# Patient Record
Sex: Female | Born: 1990 | Race: White | Hispanic: No | Marital: Married
Health system: Midwestern US, Community
[De-identification: ages and names within clinical notes are randomized; demographics above are authoritative.]

## PROBLEM LIST (undated history)

## (undated) DIAGNOSIS — Z79899 Other long term (current) drug therapy: Secondary | ICD-10-CM

## (undated) DIAGNOSIS — F909 Attention-deficit hyperactivity disorder, unspecified type: Secondary | ICD-10-CM

---
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
## 2012-04-08 IMAGING — US ABDOMEN ULTRASOUND
1 series · 17 of 25 positions shown · non-contrast
Comparison: none

REASON FOR EXAM: abdominal pain and   pelvic pain
COMMENTS:

[Series 1: abdomen ultrasound · 17 of 59 slices shown]
[im 1/59]
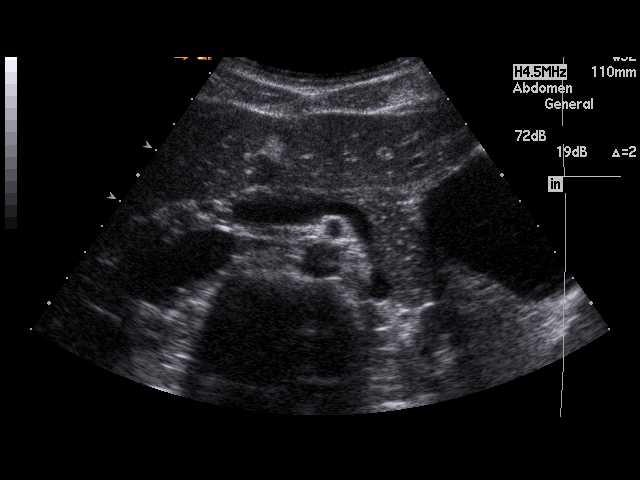
[im 5/59]
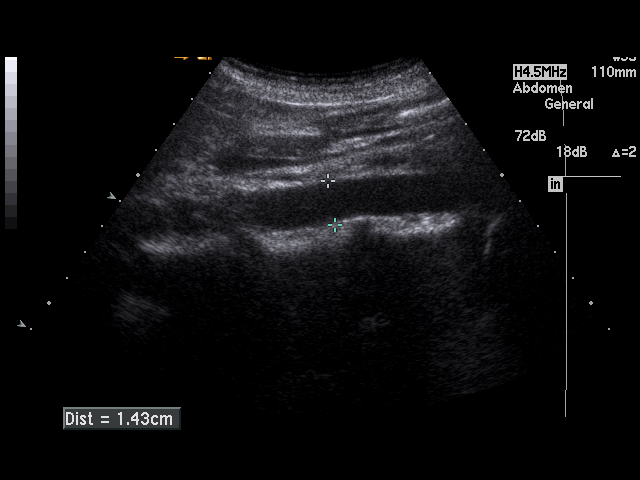
[im 8/59]
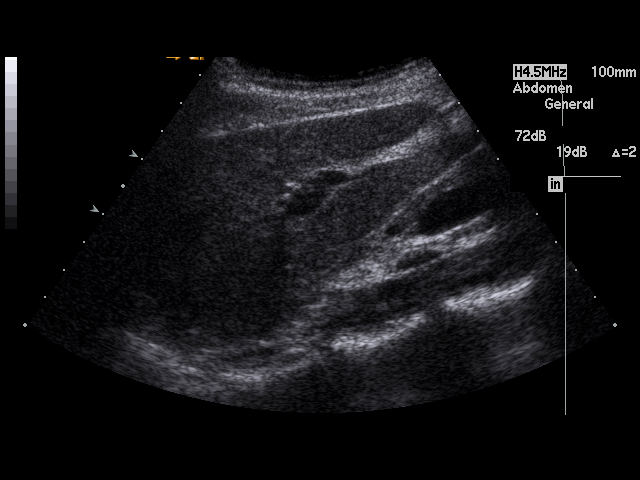
[im 13/59]
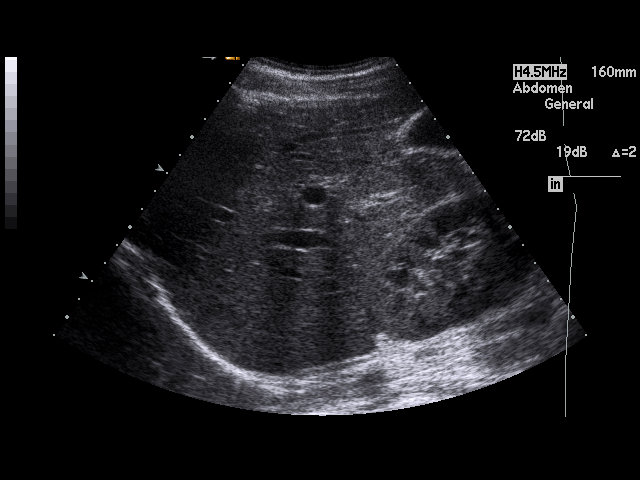
[im 15/59]
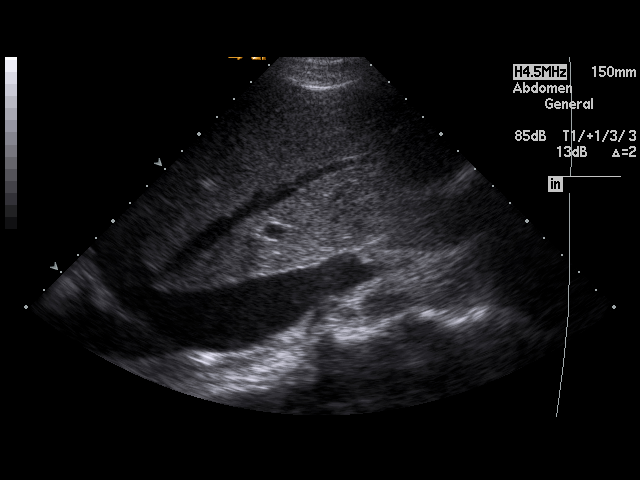
[im 20/59]
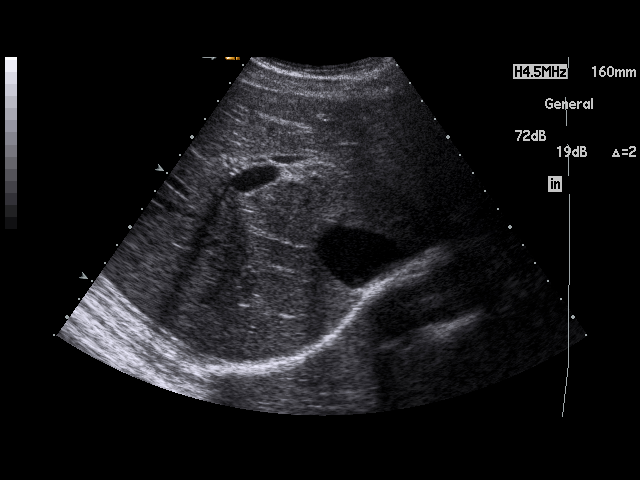
[im 22/59]
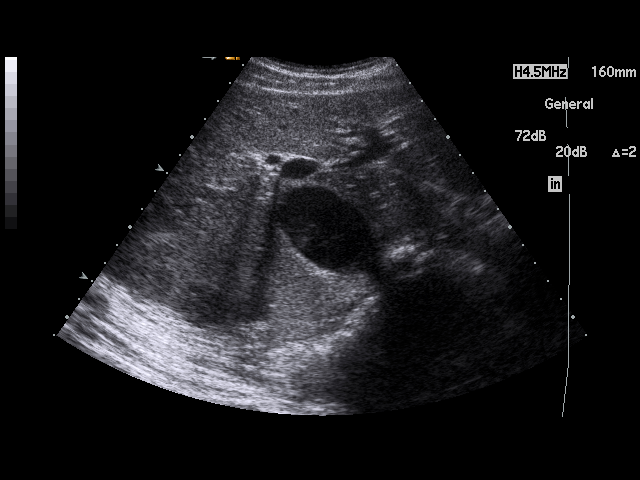
[im 27/59]
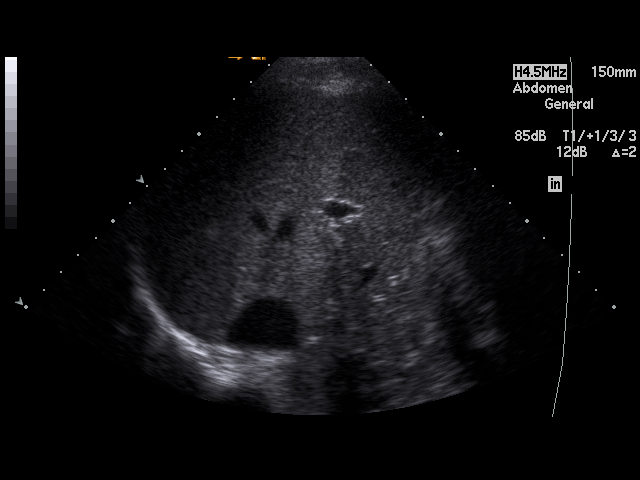
[im 30/59]
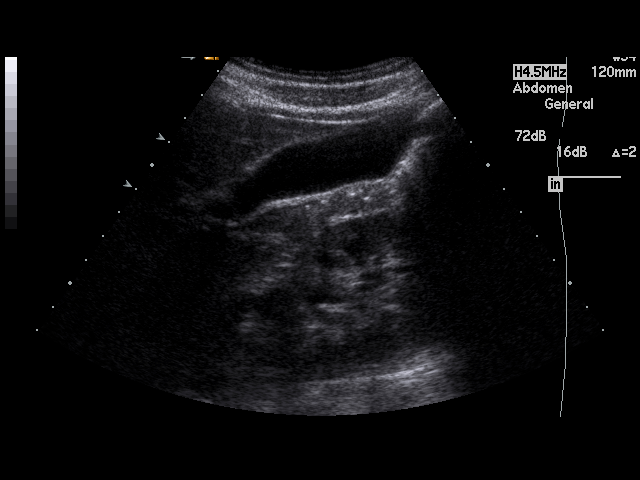
[im 32/59]
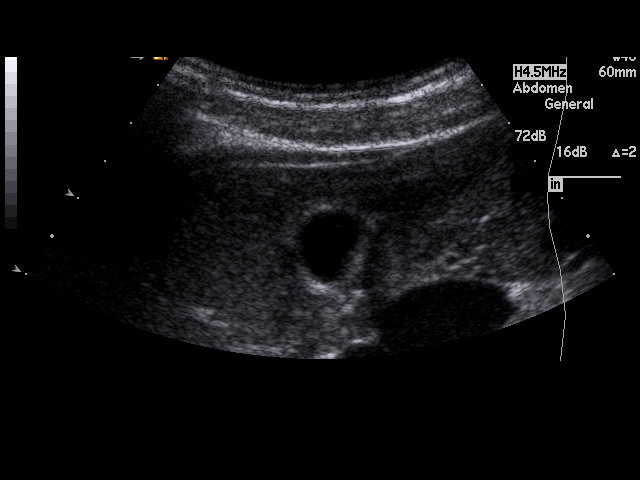
[im 37/59]
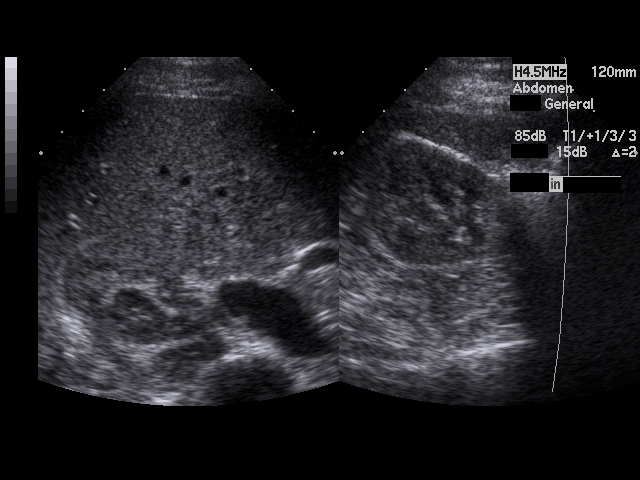
[im 39/59]
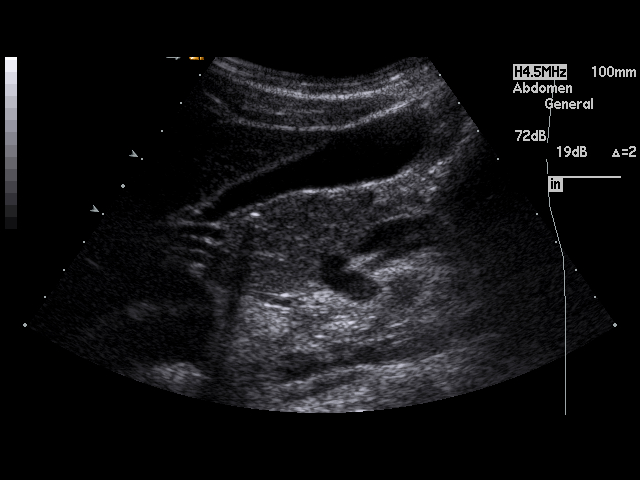
[im 44/59]
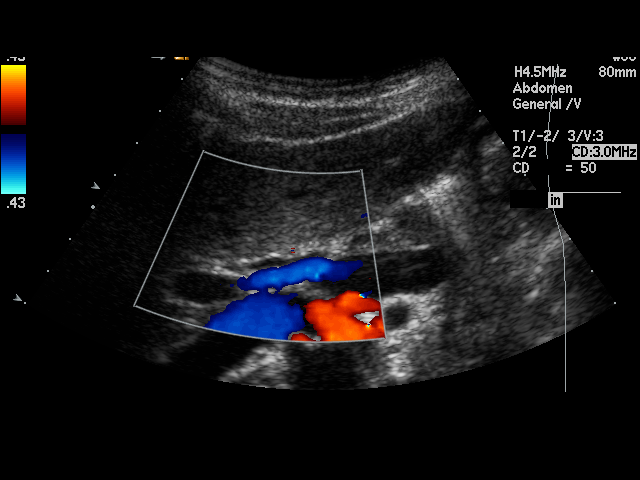
[im 46/59]
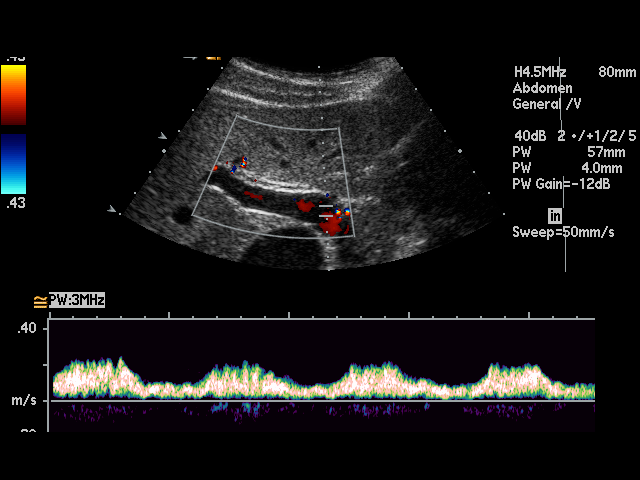
[im 51/59]
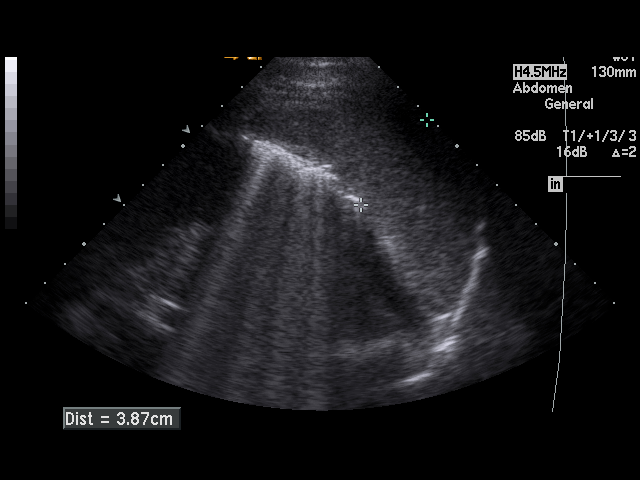
[im 54/59]
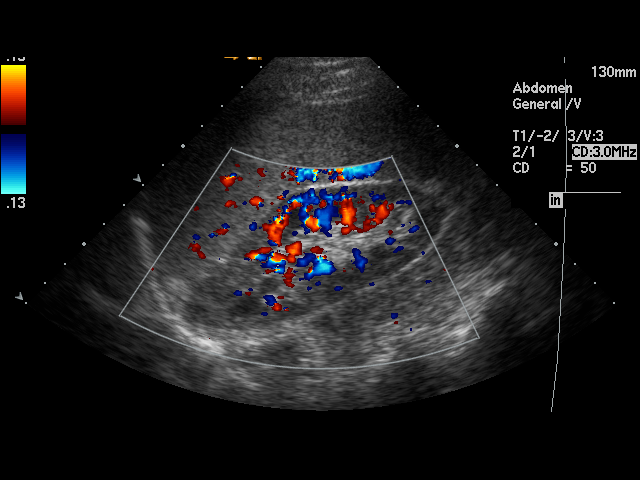
[im 59/59]
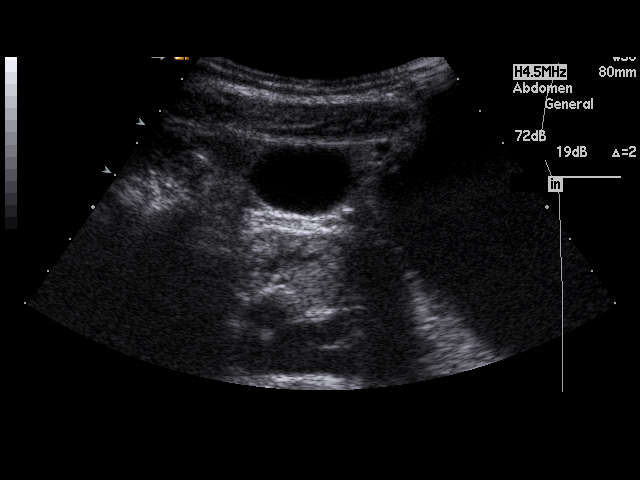

[17 of 25 positions shown; findings below may reference images not displayed]

PROCEDURE:     CALEB - CALEB ABDOMEN GENERAL SURVEY  - November 21, 2009  [DATE]

RESULT:     The liver, pancreas, abdominal aorta and inferior vena cava show
no significant abnormalities. The spleen measures 12.27 cm at maximum AP
diameter and is prominent for the patient's age. The possibility of mild
splenic enlargement cannot be excluded. No gallstones are seen. There is no
thickening of the gallbladder wall. The common bile duct measures 2.6 mm in
diameter which is within normal limits. The kidneys show no hydronephrosis.
There is no ascites.
IMPRESSION: 1. Possible mild splenic enlargement.
2. Otherwise, normal study.

## 2012-04-08 IMAGING — US TRANSABDOMINAL ULTRASOUND OF PELVIS
1 series · 17 of 25 positions shown · non-contrast
Comparison: none

REASON FOR EXAM: abd pain and   pelvic pain
COMMENTS:

[Series 1: transabdominal ultrasound of pelvis · 17 of 38 slices shown]
[im 1/38]
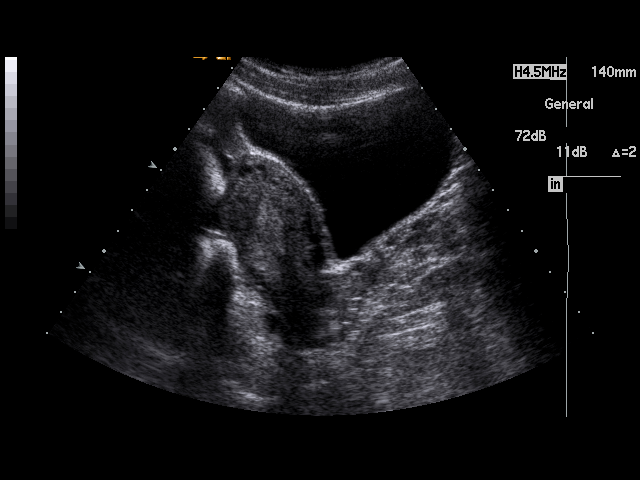
[im 4/38]
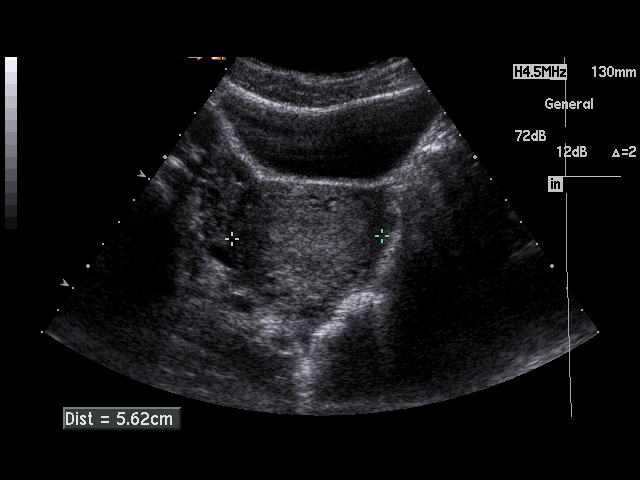
[im 5/38]
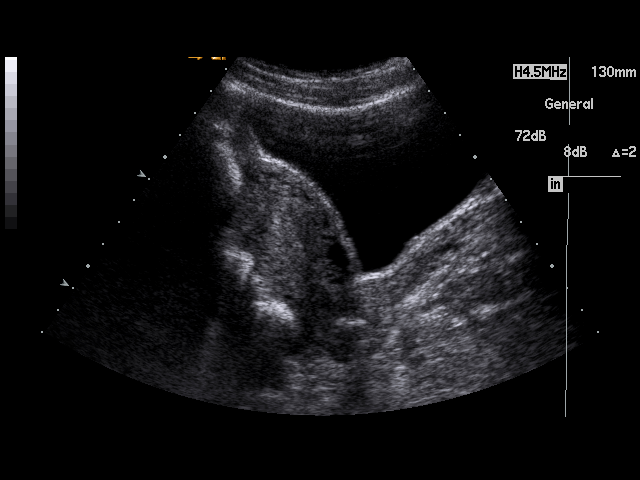
[im 8/38]
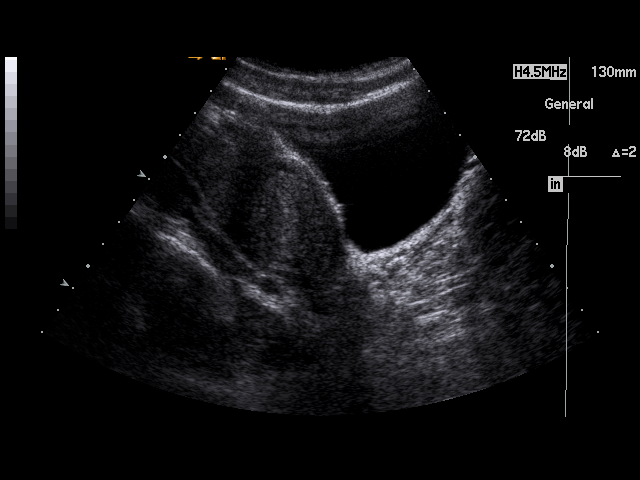
[im 10/38]
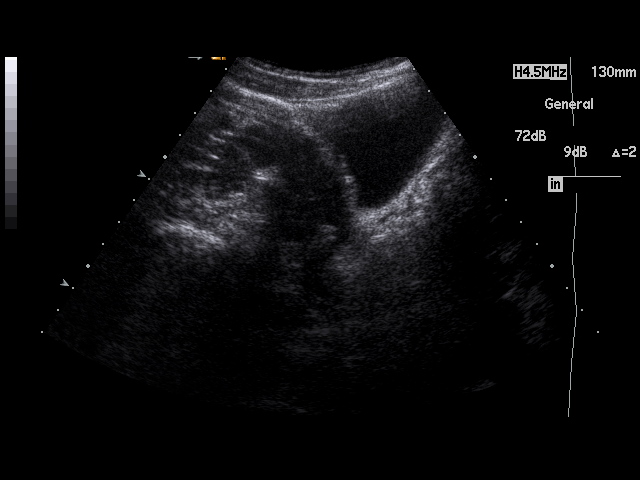
[im 13/38]
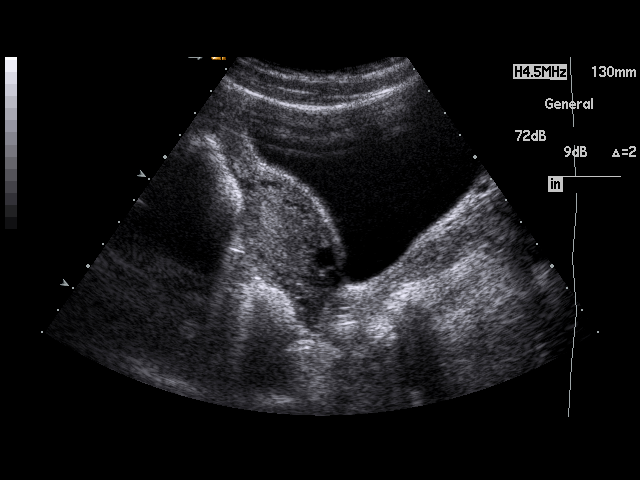
[im 14/38]
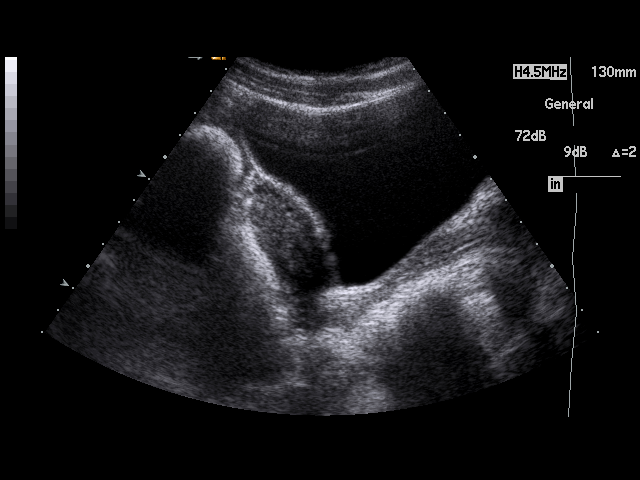
[im 17/38]
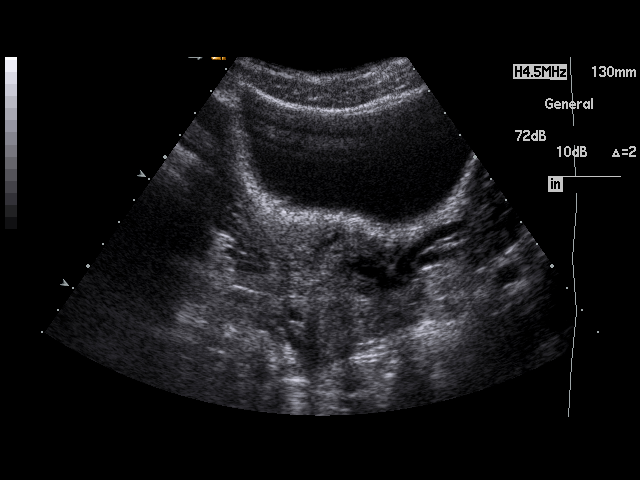
[im 19/38]
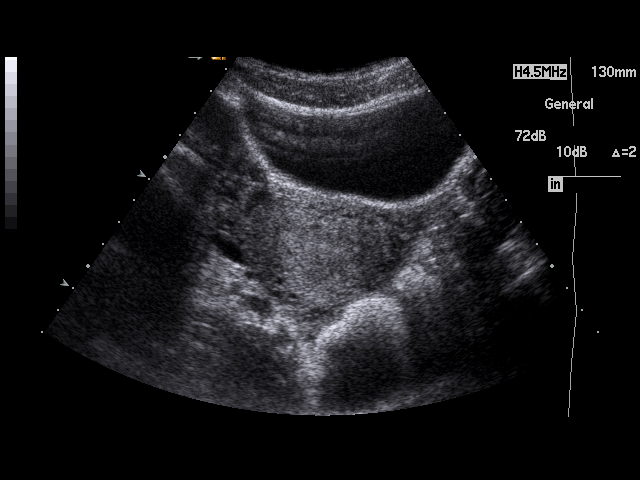
[im 21/38]
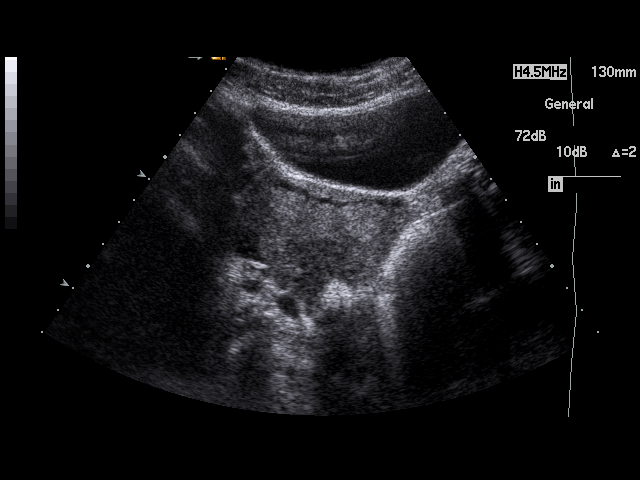
[im 24/38]
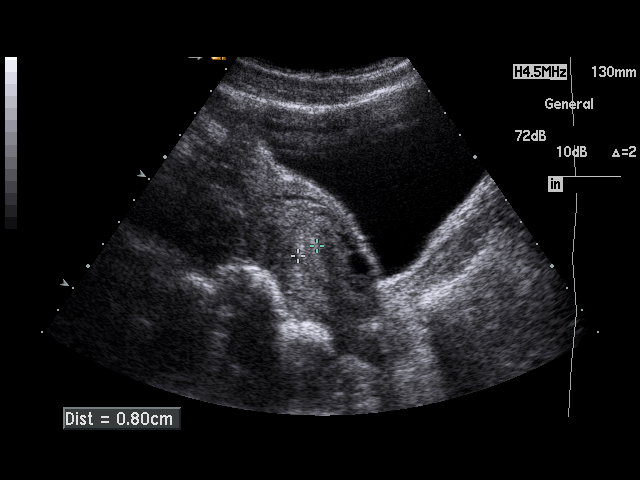
[im 25/38]
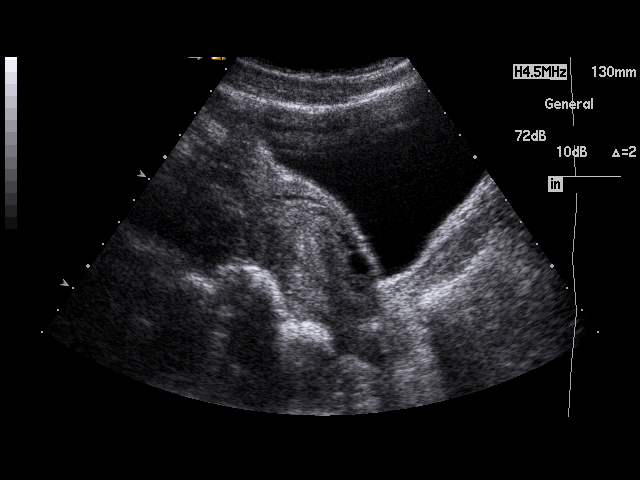
[im 28/38]
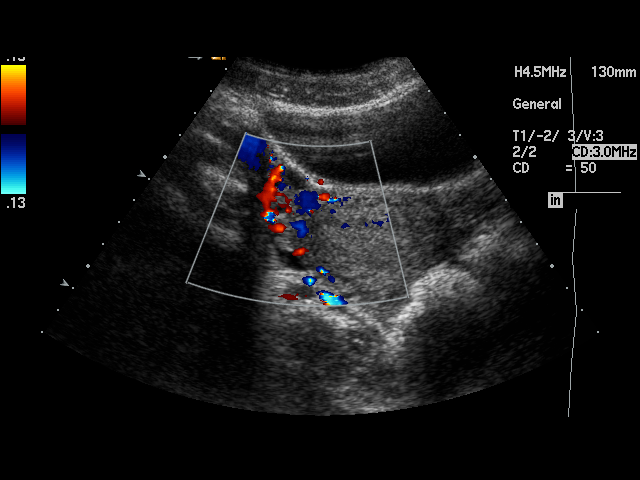
[im 30/38]
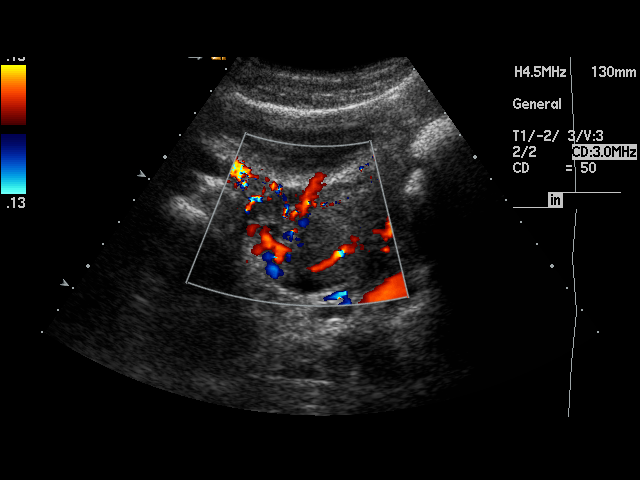
[im 33/38]
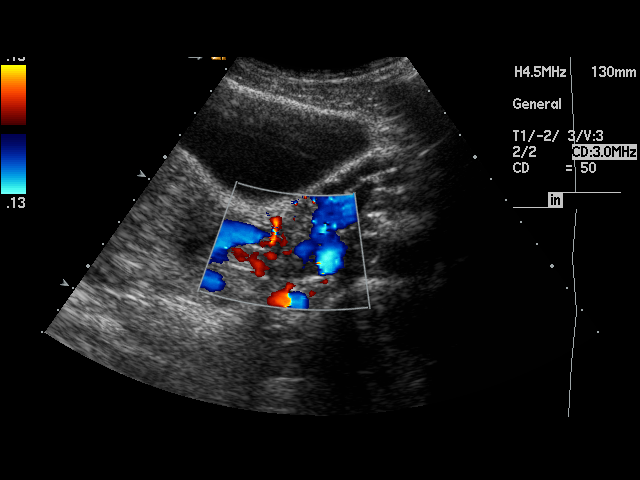
[im 34/38]
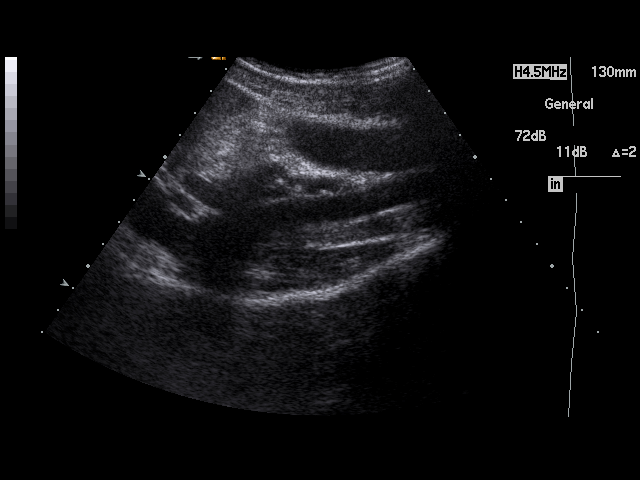
[im 38/38]
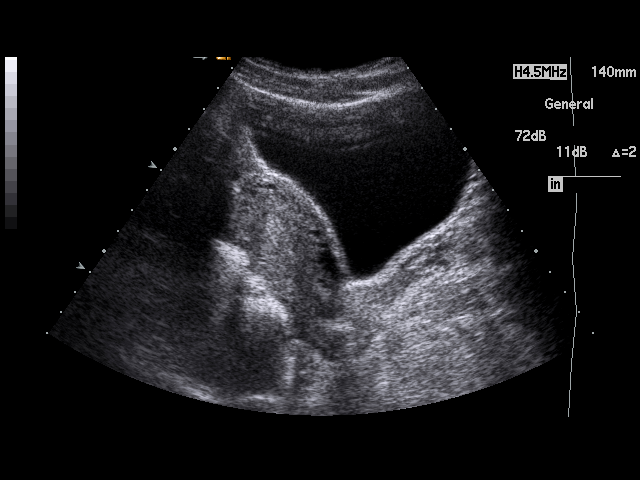

[17 of 25 positions shown; findings below may reference images not displayed]

PROCEDURE:     TIGER - TIGER PELVIS MASS EXAM  - [DATE] [DATE] [DATE]  [DATE]

RESULT:     Transabdominal Pelvic Ultrasound was performed. The uterus
measures 8.28 cm x 4.08 cm x 5.62 cm. No uterine mass lesions are seen. The
endometrium measures 8 mm in thickness. The right and left ovaries are
visualized. The right ovary measures 4.17 cm at maximum diameter and the
left ovary measures 4.32 cm at maximum diameter. No abnormal adnexal masses
are seen. No free fluid is observed in the pelvis. Vascular flow is seen in
both ovaries. The visualized portion of the urinary bladder is normal in
appearance.
IMPRESSION: No significant abnormalities are noted.

## 2013-04-05 IMAGING — CR LEFT WRIST - COMPLETE 3+ VIEW
1 series · 4 of 4 positions shown · non-contrast
Comparison: none

RESULT:      Images of the left wrist do not show a definite fracture,
dislocation or radiopaque foreign body. If there is concern for an occult
fracture followup images in 7 to 10 days would be recommended. If there is
concern for a carpal fracture followup MRI may be beneficial.

[Series 1: view not recorded · 0.17mm/px · 4 of 4 slices shown]
[im 1/4]
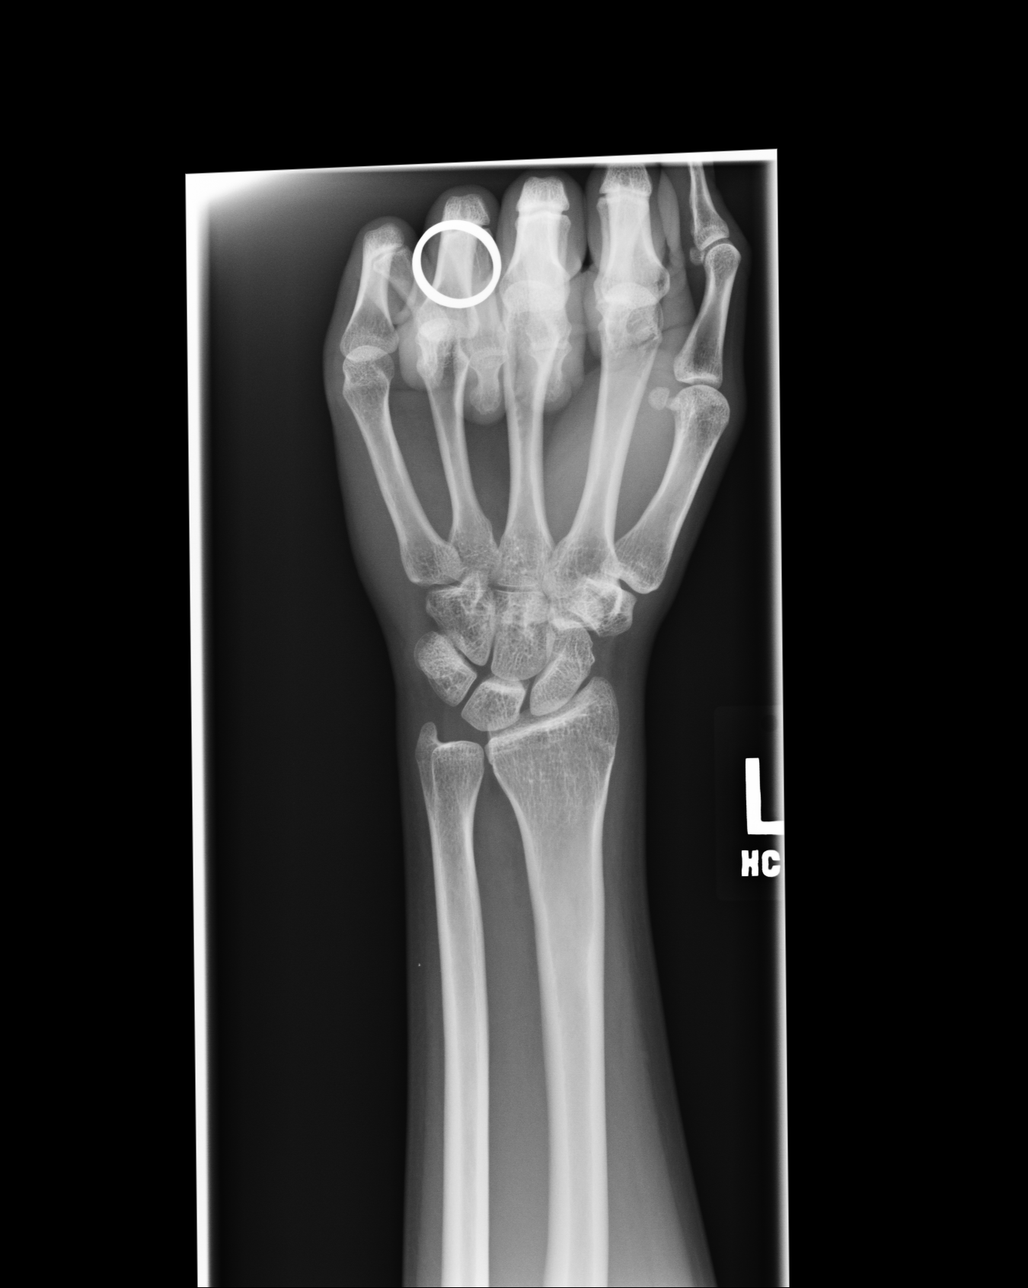
[im 2/4]
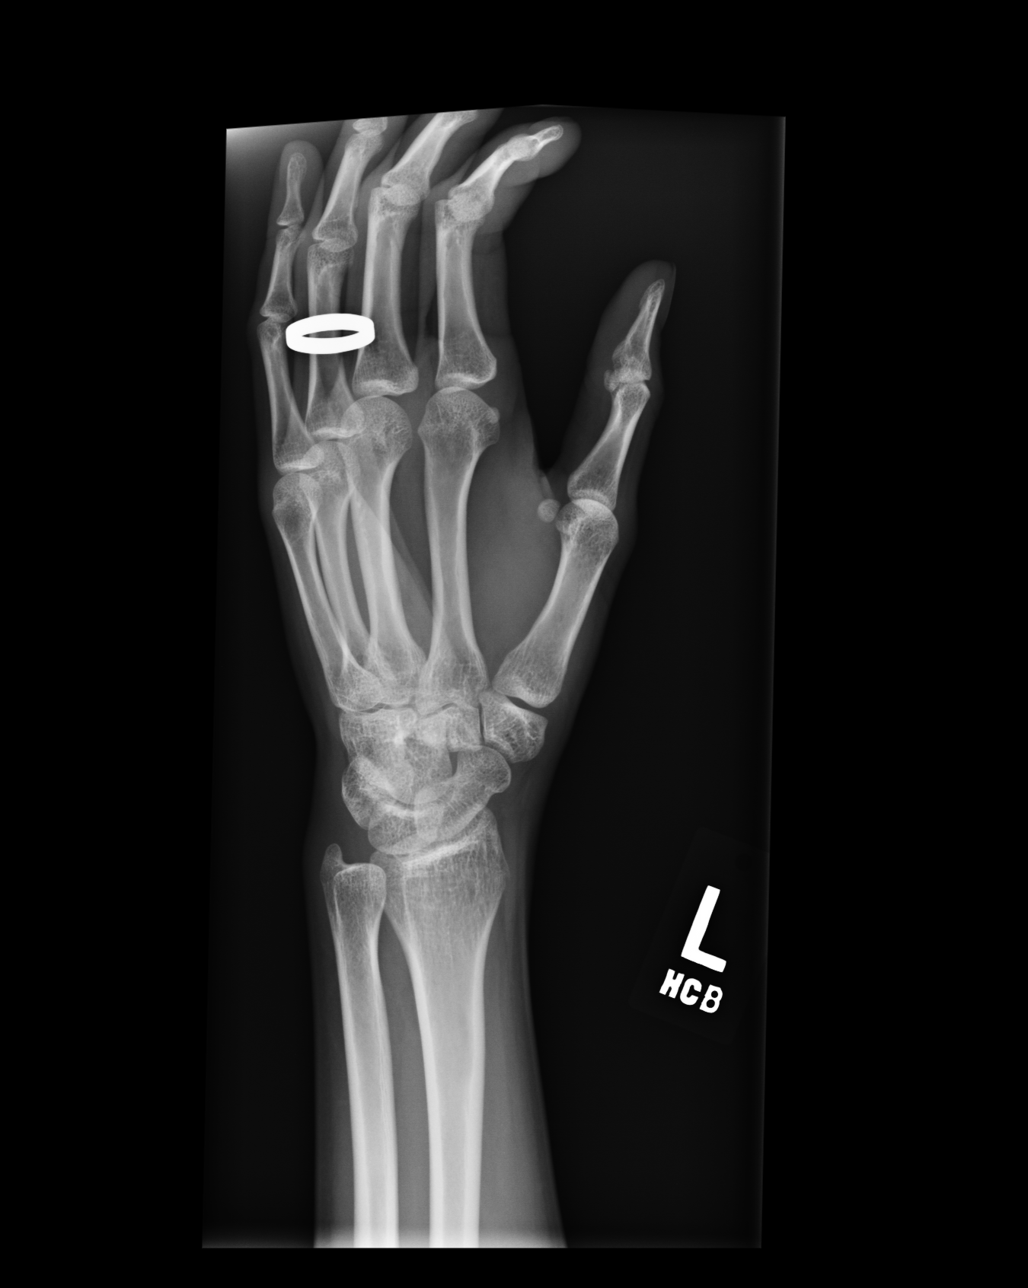
[im 3/4]
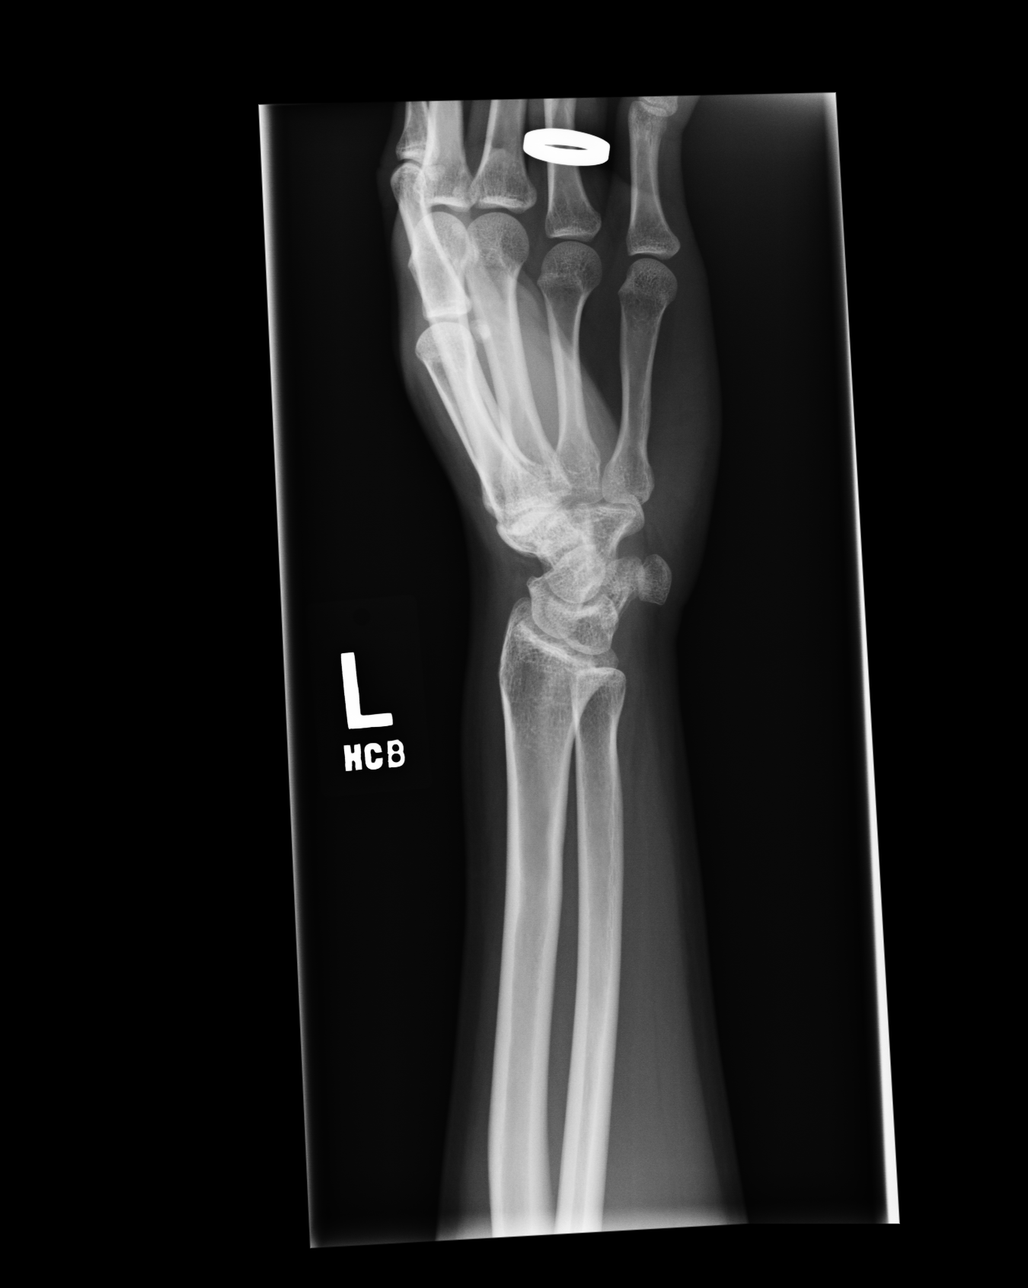
[im 4/4]
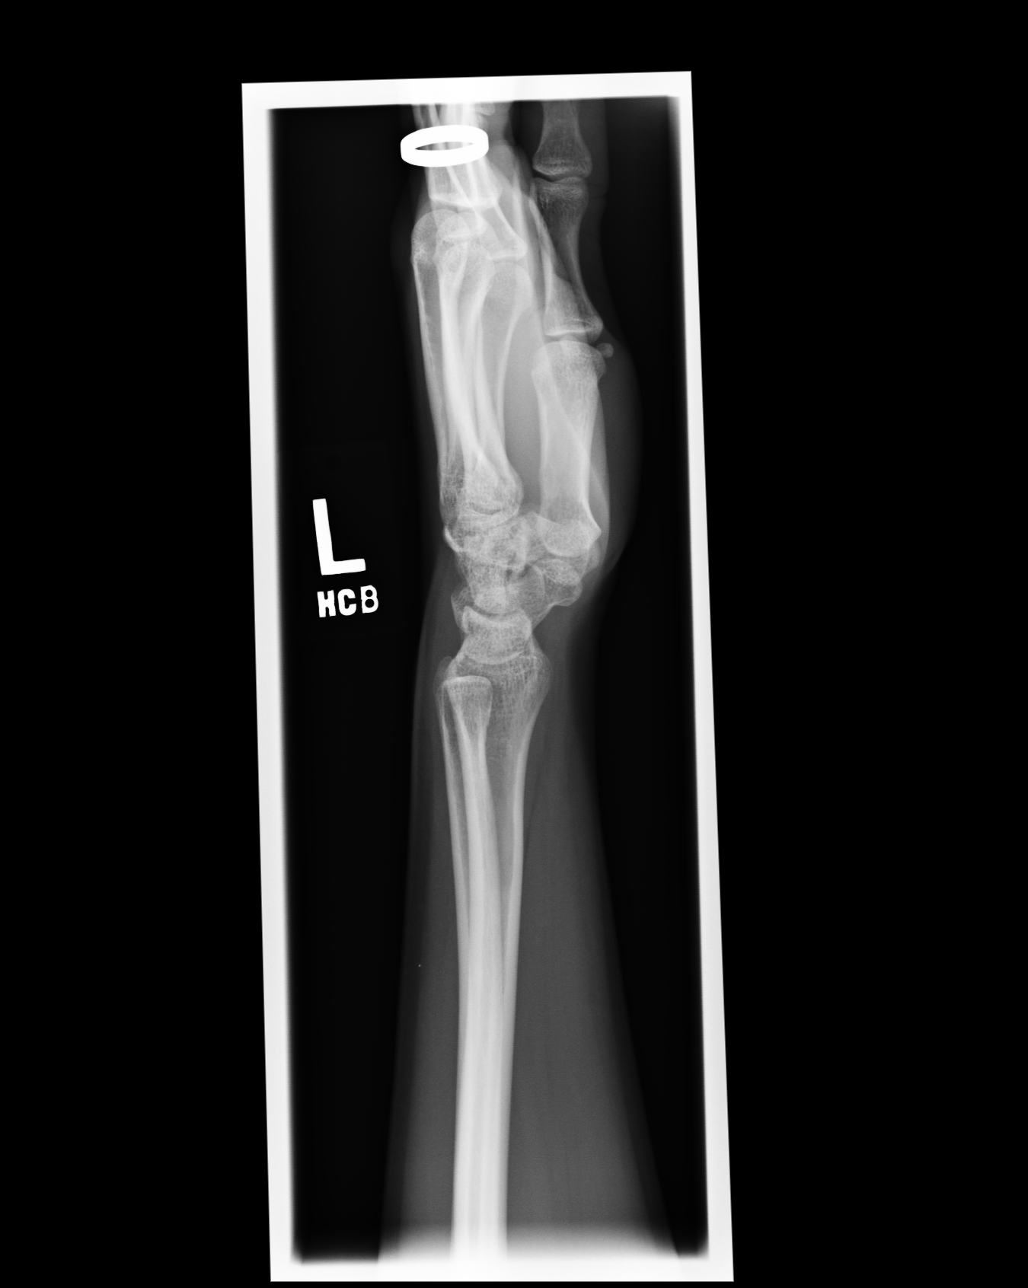

[4 of 4 positions shown; findings below may reference images not displayed]

IMPRESSION: Please see above.

## 2013-12-06 IMAGING — US ULTRASOUND RIGHT BREAST
1 series · 12 of 12 positions shown · non-contrast
Comparison: none

REASON FOR EXAM: rt brst mass 12 oclock
COMMENTS:

[Series 1: ultrasound right breast · 12 of 12 slices shown]
[im 1/12]
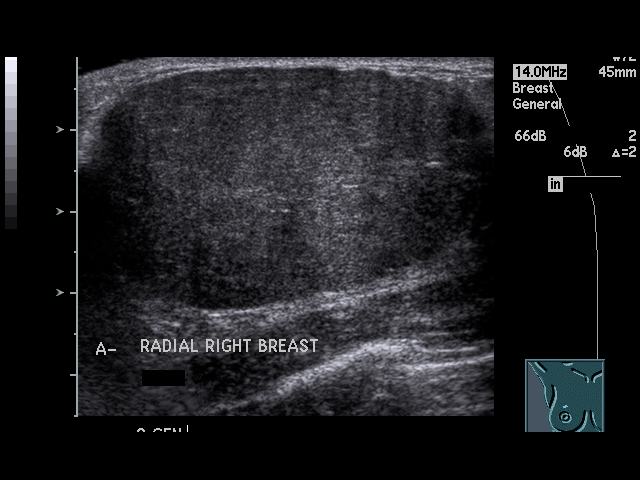
[im 2/12]
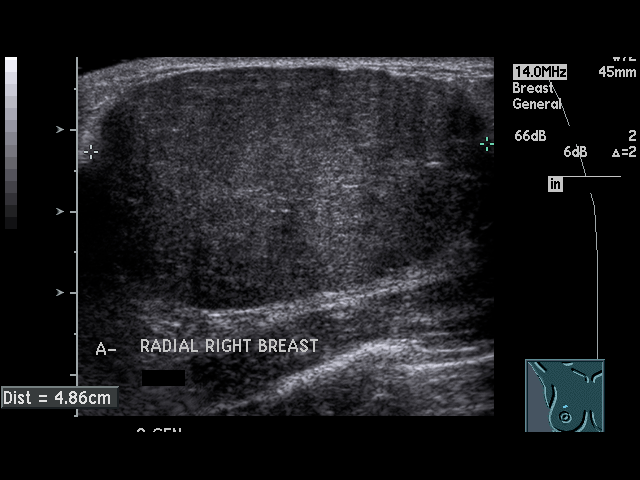
[im 3/12]
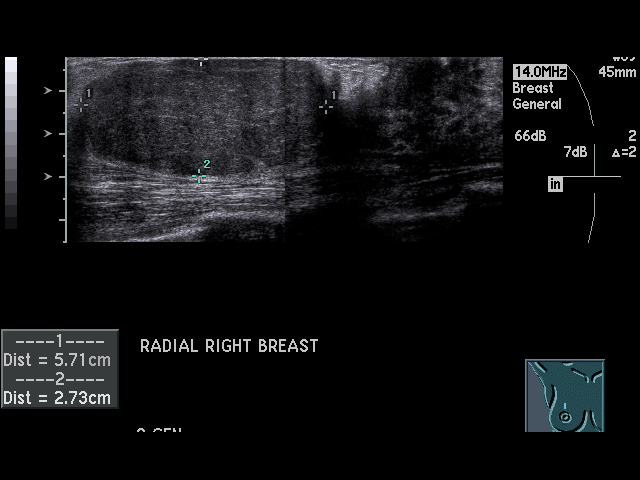
[im 4/12]
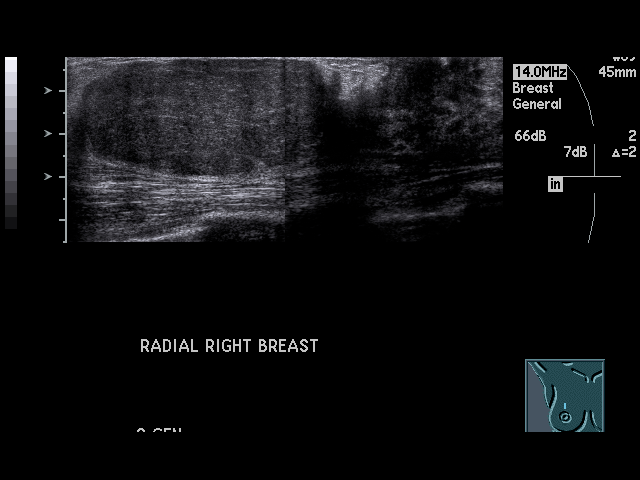
[im 5/12]
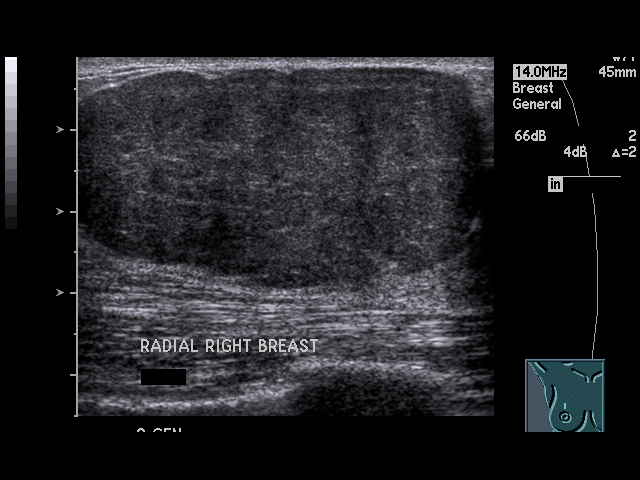
[im 6/12]
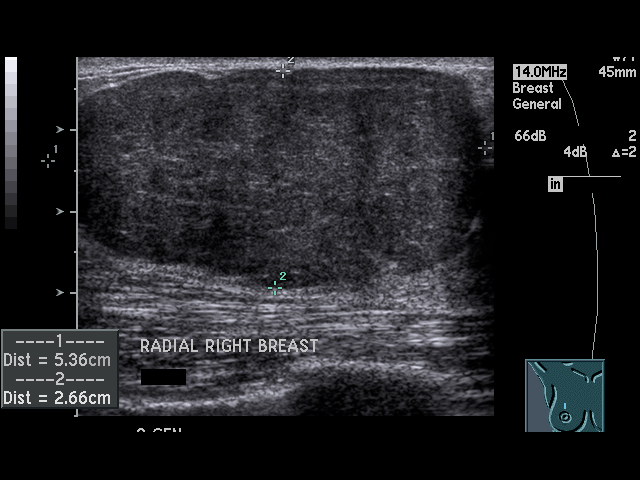
[im 7/12]
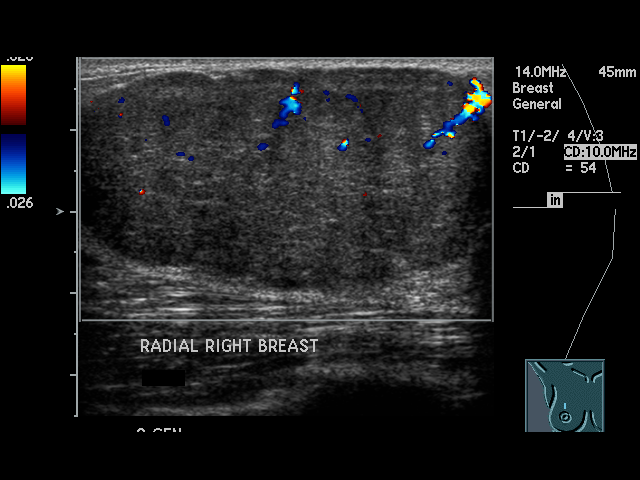
[im 8/12]
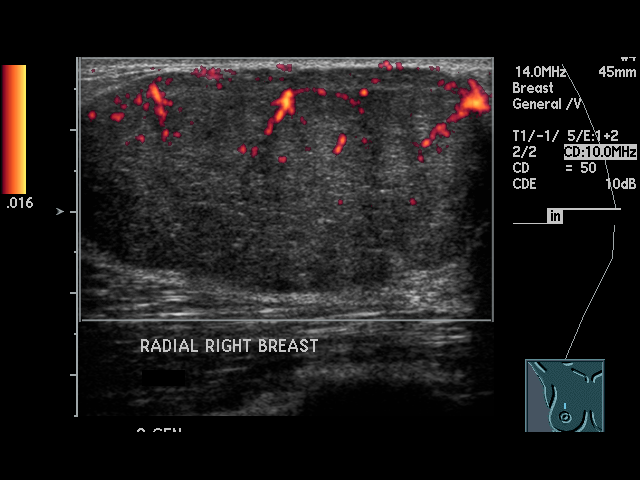
[im 9/12]
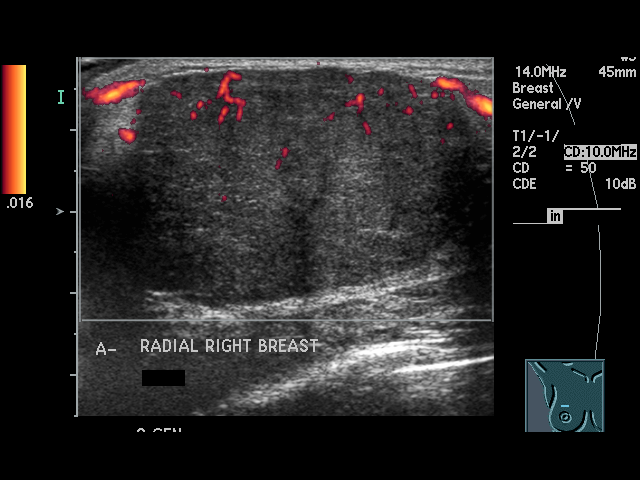
[im 10/12]
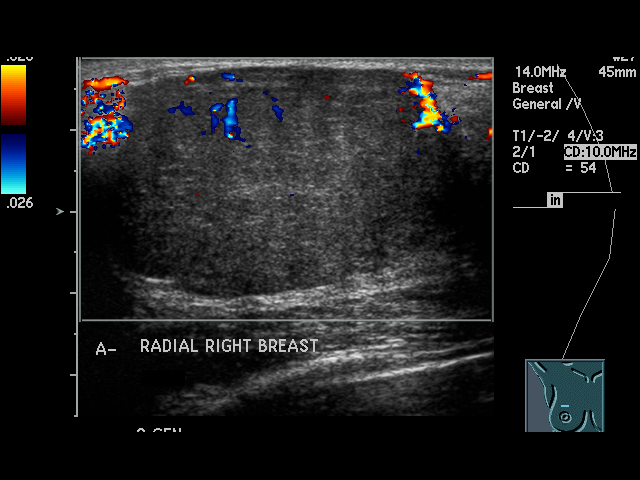
[im 11/12]
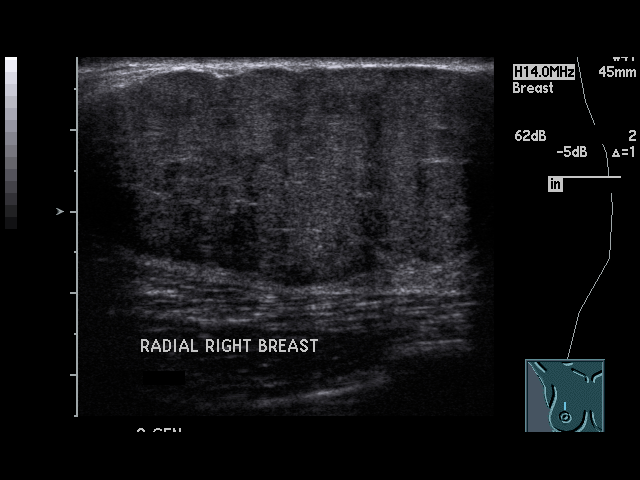
[im 12/12]
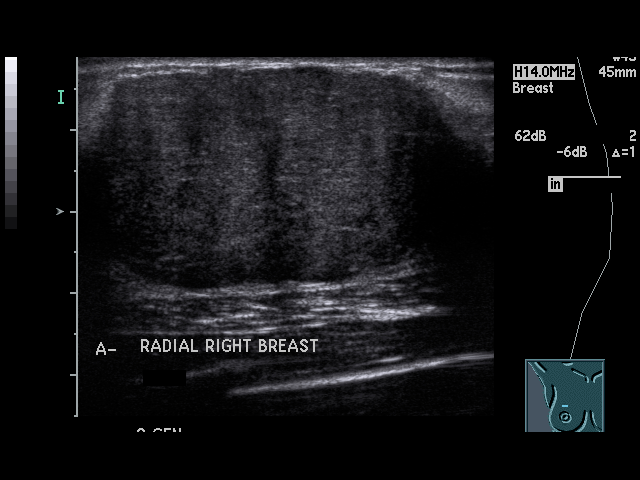

[12 of 12 positions shown; findings below may reference images not displayed]

PROCEDURE:     US  - US BREAST RIGHT  - July 21, 2011 [DATE]

RESULT:     The patient has a palpable right breast mass at 12 o'clock.
Ultrasound examination was performed and is compared to the prior exam of
November 01, 2009. A solid, oval shaped right breast mass at 12 o'clock is
again observed. On this exam, the mass measures 5.71 cm x 2.73 cm x 4.86 cm.
The mass has increased substantially in size as compared to the prior exam.
An enlarging fibroadenoma or a phyllodes tumor would be the primary
considerations as to etiology. Surgical consultation is recommended.
IMPRESSION: 1.     There is an enlarging solid mass at 12 o'clock. On this exam, the
mass measures 5.71 cm at maximum diameter.

## 2014-11-05 NOTE — Op Note (Signed)
PATIENT NAME:  Andrea NoonHARDING, Omah E MR#:  [id number] DATE OF BIRTH:  1990-12-10  DATE OF PROCEDURE:  08/08/2011  PREOPERATIVE DIAGNOSIS: Right breast mass.  POSTOPERATIVE DIAGNOSIS: Right breast mass.  OPERATION PERFORMED: Partial right mastectomy.    SURGEON: Claude MangesWilliam F. Jesiah Grismer, MD   ANESTHESIA: General.   SPECIMEN: 55 x 42 x 35 mm.   PROCEDURE IN DETAIL: The patient was placed supine on the Operating Room table and prepped and draped in the usual sterile fashion. A curvilinear superior skin incision was made in the right breast parallel with the edge of the areolar complex and about a centimeter or two away from that. This was carried down through the skin and subcutaneous tissue with electrocautery. The breast mass was essentially going from the skin to the pectoralis major muscle, and careful dissection allowed the entire mass to be excised along with a capsule or pseudocapsule. In no area was the mass violated during the dissection. This was all performed with the electrocautery, and hemostasis at the conclusion of the procedure was achieved with electrocautery. A small portion of pectoralis major muscle fascia was excised at the base. The wound was irrigated with warm normal saline which was suctioned clear, and a subdermal closure of interrupted 4-0 Monocryl was performed; and the skin was reapproximated with a running subcuticular 5-0 Monocryl, Mastisol, Steri-Strips and a compressive dressing. The patient tolerated the procedure well. There were no complications.   ____________________________ Claude MangesWilliam F. Lorenzo Pereyra, MD wfm:cbb D: 08/08/2011 12:29:19 ET T: 08/08/2011 13:01:00 ET JOB#: [id number]  cc: Claude MangesWilliam F. Juanmiguel Defelice, MD, <Dictator> Hennie Duosharles K. Lorin PicketScott, MD Claude MangesWILLIAM F Zoria Rawlinson MD ELECTRONICALLY SIGNED 08/09/2011 10:55

## 2015-03-28 ENCOUNTER — Ambulatory Visit: Payer: Self-pay | Admitting: Podiatry

## 2015-04-10 ENCOUNTER — Ambulatory Visit: Payer: Self-pay | Admitting: Podiatry

## 2016-11-11 ENCOUNTER — Other Ambulatory Visit: Payer: Self-pay | Admitting: Neurology

## 2016-11-11 DIAGNOSIS — G43719 Chronic migraine without aura, intractable, without status migrainosus: Secondary | ICD-10-CM

## 2016-11-22 ENCOUNTER — Ambulatory Visit: Payer: BLUE CROSS/BLUE SHIELD

## 2021-06-17 ENCOUNTER — Encounter
Admit: 2021-06-17 | Discharge: 2021-06-17 | Payer: BLUE CROSS/BLUE SHIELD | Attending: Family Medicine | Primary: Family Medicine

## 2021-06-17 DIAGNOSIS — F909 Attention-deficit hyperactivity disorder, unspecified type: Secondary | ICD-10-CM

## 2021-06-17 LAB — URINE DRUG SCREEN
Amphetamine, Urine: POSITIVE — AB
Barbiturates, Urine: NEGATIVE
Benzodiazepines, Urine: NEGATIVE
Cocaine, Urine: NEGATIVE
Methadone, Urine: NEGATIVE
Opiates, Urine: NEGATIVE
PCP, Urine: NEGATIVE
THC, TH-Cannabinol, Urine: NEGATIVE

## 2021-06-17 NOTE — Progress Notes (Signed)
Kharma Sampsel is a 30 y.o. female who presents today for the following:  Chief Complaint   Patient presents with    ADHD       No Known Allergies    No current outpatient medications on file.     No current facility-administered medications for this visit.       Past Medical History:   Diagnosis Date    ADHD (attention deficit hyperactivity disorder)        Past Surgical History:   Procedure Laterality Date    BREAST LUMPECTOMY Right     WISDOM TOOTH EXTRACTION         Social History     Tobacco Use    Smoking status: Never    Smokeless tobacco: Never   Substance Use Topics    Alcohol use: Yes     Comment: social        Family History   Problem Relation Age of Onset    Heart Attack Father     Esophageal Cancer Maternal Grandfather        Patient is here today for initial visit.  She moved to [place] area one year ago and needs to establish with a new provider. She has long standing history of ADHD diagnosed around age 41. Patient has been on adderral since 30 years old.  Used medication through college.  Has also tried vyvanse and concerta in the past.  No significant side effect. Currently taking 10 mg ER but feels like it wears off around noon.  She has been receiving prescription from psychiatrist but visits are costly. She also has a history of migraines but is no longer taking medication.     Contraception: mirena IUD placed around 5 years ago.   Pap: around 6 years ago  Social: engaged.     Immunizations  Flu: declines  COVID: received 3 doses    Exercise: every other day  Diet: regular         Review of Systems   Constitutional:  Negative for appetite change, fatigue and unexpected weight change.   Respiratory:  Negative for chest tightness, shortness of breath and wheezing.    Cardiovascular:  Negative for chest pain and palpitations.   Gastrointestinal:  Negative for abdominal pain and constipation.   Neurological:  Negative for dizziness, light-headedness and headaches.   Psychiatric/Behavioral:  Negative  for sleep disturbance. The patient is not nervous/anxious.       BP 126/74    Pulse 78    Ht 5\' 7"  (1.702 m)    Wt 170 lb (77.1 kg)    SpO2 98%    BMI 26.63 kg/m??     Physical Exam  Vitals reviewed.   Constitutional:       General: She is not in acute distress.     Appearance: Normal appearance. She is normal weight. She is not ill-appearing.   Eyes:      General: No scleral icterus.     Conjunctiva/sclera: Conjunctivae normal.   Cardiovascular:      Rate and Rhythm: Normal rate and regular rhythm.      Heart sounds: Normal heart sounds. No murmur heard.  Pulmonary:      Effort: Pulmonary effort is normal. No respiratory distress.      Breath sounds: Normal breath sounds. No wheezing.   Musculoskeletal:      Cervical back: Neck supple.      Right lower leg: No edema.      Left lower leg: No  edema.   Neurological:      General: No focal deficit present.      Mental Status: She is oriented to person, place, and time.   Psychiatric:         Mood and Affect: Mood normal.         Behavior: Behavior normal.        1. Attention deficit hyperactivity disorder (ADHD), unspecified ADHD type  Assessment & Plan:  Diagnosed in early childhood.  Has been on and off stimulant for years. Discussed risks and benefits of stimulant use. Patient wishes to continue with Adderall XR.  Has also taken vyvanse and concerta in the past with no side effects. Currently taking Adderall XR 10 mg.  Feels medication wears off early.  Would like to try increasing to 15 mg daily.  Signed controlled substance agreement today (06/17/2021).  Reviewed scripts database and drug screen performed.   2. Long term current use of therapeutic drug  -     Urine Drug Screen; Future  3. IUD contraception  Due for pap smear and possible replacement of IUD.  Referral to GYN.   [facility], [place]     Patient informed, we will call with blood work results within one week.  If you have not heard regarding results in over a week, please  contact office.  You can also review results on mychart.       Follow-up and Dispositions    Return in about 3 months (around 09/15/2021).         Norva Pavlov, MD

## 2021-06-17 NOTE — Assessment & Plan Note (Signed)
Diagnosed in early childhood.  Has been on and off stimulant for years. Discussed risks and benefits of stimulant use. Patient wishes to continue with Adderall XR.  Has also taken vyvanse and concerta in the past with no side effects. Currently taking Adderall XR 10 mg.  Feels medication wears off early.  Would like to try increasing to 15 mg daily.  Signed controlled substance agreement today (06/17/2021).  Reviewed scripts database and drug screen performed.

## 2021-06-18 MED ORDER — AMPHETAMINE-DEXTROAMPHET ER 15 MG PO CP24
15 MG | ORAL_CAPSULE | Freq: Every day | ORAL | 0 refills | Status: DC
Start: 2021-06-18 — End: 2021-09-16

## 2021-06-18 MED ORDER — AMPHETAMINE-DEXTROAMPHET ER 15 MG PO CP24
15 MG | ORAL_CAPSULE | Freq: Every day | ORAL | 0 refills | Status: AC
Start: 2021-06-18 — End: 2021-07-17

## 2021-06-18 NOTE — Addendum Note (Signed)
Addended by: Norva Pavlov on: 06/18/2021 06:02 AM     Modules accepted: Orders

## 2021-09-16 ENCOUNTER — Encounter
Admit: 2021-09-16 | Discharge: 2021-09-16 | Payer: BLUE CROSS/BLUE SHIELD | Attending: Family Medicine | Primary: Family Medicine

## 2021-09-16 DIAGNOSIS — F909 Attention-deficit hyperactivity disorder, unspecified type: Secondary | ICD-10-CM

## 2021-09-16 MED ORDER — AMPHETAMINE-DEXTROAMPHETAMINE 5 MG PO TABS
5 MG | ORAL_TABLET | Freq: Every day | ORAL | 0 refills | Status: DC
Start: 2021-09-16 — End: 2021-09-18

## 2021-09-16 MED ORDER — AMPHETAMINE-DEXTROAMPHET ER 15 MG PO CP24
15 MG | ORAL_CAPSULE | Freq: Every day | ORAL | 0 refills | Status: DC
Start: 2021-09-16 — End: 2021-09-18

## 2021-09-16 NOTE — Assessment & Plan Note (Addendum)
Diagnosed in early childhood.  Has been on and off stimulant for years. Discussed risks and benefits of stimulant use. Patient wishes to continue with Adderall XR.  Has also taken vyvanse and concerta in the past with no side effects. Currently taking Adderall XR 15 mg.  Feels medication wears off early.  Adding low dose of short acting after lunch. Signed controlled substance agreement on 06/2021.  Reviewed scripts database.

## 2021-09-16 NOTE — Telephone Encounter (Signed)
PA submitted for Adderall and awaiting response. Key: [id number]

## 2021-09-16 NOTE — Progress Notes (Signed)
Jenna Sullivan is a 31 y.o. female who presents today for the following:  Chief Complaint   Patient presents with    Medication Refill       No Known Allergies    Current Outpatient Medications   Medication Sig Dispense Refill    amphetamine-dextroamphetamine (ADDERALL XR) 15 MG extended release capsule Take 1 capsule by mouth daily for 29 days. 30 capsule 0    amphetamine-dextroamphetamine (ADDERALL XR) 15 MG extended release capsule Take 1 capsule by mouth daily for 29 days. 30 capsule 0    amphetamine-dextroamphetamine (ADDERALL XR) 15 MG extended release capsule Take 1 capsule by mouth daily for 29 days. 30 capsule 0     No current facility-administered medications for this visit.       Past Medical History:   Diagnosis Date    ADHD (attention deficit hyperactivity disorder)        Past Surgical History:   Procedure Laterality Date    BREAST LUMPECTOMY Right     WISDOM TOOTH EXTRACTION         Social History     Tobacco Use    Smoking status: Never    Smokeless tobacco: Never   Substance Use Topics    Alcohol use: Yes     Comment: social        Family History   Problem Relation Age of Onset    Heart Attack Father     Esophageal Cancer Maternal Grandfather        Patient is here today for follow up regarding ADHD.  She has long standing history of ADHD diagnosed around age 63. Patient has been on adderral since 31 years old.  Used medication through college.  Has also tried vyvanse and concerta in the past.  No significant side effect. Previously took 10 mg ER but feels like it wears off around noon.  At last visit increased dose to 15 mg ER to see if helped. She continues to feel sluggish around 3 PM. She would like to know if she can add on a small dose of medication at that time. She also has a history of migraines but is no longer taking medication.     Contraception: mirena IUD placed around 5 years ago.   Pap: around 6 years ago  Social: engaged.     Immunizations  Flu: declines  COVID: received 3  doses    Exercise: every other day  Diet: regular         Review of Systems   Constitutional:  Negative for appetite change, fatigue and unexpected weight change.   Respiratory:  Negative for chest tightness and shortness of breath.    Cardiovascular:  Negative for chest pain.   Gastrointestinal:  Negative for constipation.   Neurological:  Negative for dizziness and light-headedness.   Psychiatric/Behavioral:  The patient is not nervous/anxious.       BP (!) 106/58    Pulse 64    Ht 5\' 7"  (1.702 m)    Wt 170 lb (77.1 kg)    SpO2 98%    BMI 26.63 kg/m??     Physical Exam  Vitals reviewed.   Constitutional:       General: She is not in acute distress.     Appearance: Normal appearance. She is normal weight. She is not ill-appearing.   Eyes:      General: No scleral icterus.     Conjunctiva/sclera: Conjunctivae normal.   Cardiovascular:      Rate and  Rhythm: Normal rate and regular rhythm.      Heart sounds: Normal heart sounds. No murmur heard.  Pulmonary:      Effort: Pulmonary effort is normal. No respiratory distress.      Breath sounds: Normal breath sounds. No wheezing.   Musculoskeletal:      Cervical back: Neck supple.      Right lower leg: No edema.      Left lower leg: No edema.   Neurological:      General: No focal deficit present.      Mental Status: She is alert and oriented to person, place, and time.   Psychiatric:         Mood and Affect: Mood normal.         Behavior: Behavior normal.        1. Attention deficit hyperactivity disorder (ADHD), unspecified ADHD type  Assessment & Plan:  Diagnosed in early childhood.  Has been on and off stimulant for years. Discussed risks and benefits of stimulant use. Patient wishes to continue with Adderall XR.  Has also taken vyvanse and concerta in the past with no side effects. Currently taking Adderall XR 15 mg.  Feels medication wears off early.  Adding low dose of short acting after lunch. Signed controlled substance agreement on 06/2021.  Reviewed scripts  database.  Orders:  -     amphetamine-dextroamphetamine (ADDERALL XR) 15 MG extended release capsule; Take 1 capsule by mouth daily for 30 days. Max Daily Amount: 15 mg, Disp-30 capsule, R-0Normal  -     amphetamine-dextroamphetamine (ADDERALL XR) 15 MG extended release capsule; Take 1 capsule by mouth daily for 30 days. Max Daily Amount: 15 mg, Disp-30 capsule, R-0Normal  -     amphetamine-dextroamphetamine (ADDERALL XR) 15 MG extended release capsule; Take 1 capsule by mouth daily for 30 days. Max Daily Amount: 15 mg, Disp-30 capsule, R-0Normal  -     amphetamine-dextroamphetamine (ADDERALL) 5 MG tablet; Take 1 tablet by mouth daily for 30 days. In the afternoon Max Daily Amount: 5 mg, Disp-30 tablet, R-0Normal  -     amphetamine-dextroamphetamine (ADDERALL) 5 MG tablet; Take 1 tablet by mouth daily for 30 days. In the afternoon Max Daily Amount: 5 mg, Disp-30 tablet, R-0Normal  -     amphetamine-dextroamphetamine (ADDERALL) 5 MG tablet; Take 1 tablet by mouth daily for 30 days. Daily in the afternoon Max Daily Amount: 5 mg, Disp-30 tablet, R-0Normal  2. IUD contraception  Assessment & Plan:   Has upcoming appointment with Gyn.     Patient informed, we will call with blood work results within one week.  If you have not heard regarding results in over a week, please contact office.  You can also review results on mychart.           Norva Pavlov, MD

## 2021-09-16 NOTE — Assessment & Plan Note (Signed)
Has upcoming appointment with Gyn.

## 2021-09-17 ENCOUNTER — Encounter

## 2021-09-18 MED ORDER — AMPHETAMINE-DEXTROAMPHET ER 15 MG PO CP24
15 MG | ORAL_CAPSULE | Freq: Every day | ORAL | 0 refills | Status: AC
Start: 2021-09-18 — End: 2021-10-18

## 2021-09-18 MED ORDER — AMPHETAMINE-DEXTROAMPHET ER 15 MG PO CP24
15 MG | ORAL_CAPSULE | Freq: Every day | ORAL | 0 refills | Status: DC
Start: 2021-09-18 — End: 2021-12-16

## 2021-09-18 MED ORDER — AMPHETAMINE-DEXTROAMPHETAMINE 5 MG PO TABS
5 MG | ORAL_TABLET | Freq: Every day | ORAL | 0 refills | Status: AC
Start: 2021-09-18 — End: 2021-11-15

## 2021-09-18 MED ORDER — AMPHETAMINE-DEXTROAMPHETAMINE 5 MG PO TABS
5 MG | ORAL_TABLET | Freq: Every day | ORAL | 0 refills | Status: AC
Start: 2021-09-18 — End: 2021-10-18

## 2021-09-18 MED ORDER — AMPHETAMINE-DEXTROAMPHETAMINE 5 MG PO TABS
5 MG | ORAL_TABLET | Freq: Every day | ORAL | 0 refills | Status: DC
Start: 2021-09-18 — End: 2021-12-16

## 2021-09-18 NOTE — Telephone Encounter (Signed)
PA submitted for Adderall and awaiting response. Key: [id number]

## 2021-09-18 NOTE — Telephone Encounter (Signed)
Diagnoses and all orders for this visit:    Attention deficit hyperactivity disorder (ADHD), unspecified ADHD type  -     amphetamine-dextroamphetamine (ADDERALL XR) 15 MG extended release capsule; Take 1 capsule by mouth daily for 30 days. Max Daily Amount: 15 mg  -     amphetamine-dextroamphetamine (ADDERALL XR) 15 MG extended release capsule; Take 1 capsule by mouth daily for 30 days. Max Daily Amount: 15 mg  -     amphetamine-dextroamphetamine (ADDERALL XR) 15 MG extended release capsule; Take 1 capsule by mouth daily for 30 days. Max Daily Amount: 15 mg  -     amphetamine-dextroamphetamine (ADDERALL) 5 MG tablet; Take 1 tablet by mouth daily for 30 days. In the afternoon Max Daily Amount: 5 mg  -     amphetamine-dextroamphetamine (ADDERALL) 5 MG tablet; Take 1 tablet by mouth daily for 30 days. In the afternoon Max Daily Amount: 5 mg  -     amphetamine-dextroamphetamine (ADDERALL) 5 MG tablet; Take 1 tablet by mouth daily for 30 days. Daily in the afternoon Max Daily Amount: 5 mg

## 2021-09-18 NOTE — Telephone Encounter (Signed)
From: Leata Mouse  To: Dr. Norva Pavlov  Sent: 09/17/2021 6:53 PM EST  Subject: Adderall 5mg     Good evening I called the pharmacy and good news they are able to fill the 15mg  xr but they are needing prior authorization to fill the 5mg  fast acting. Is this something you take care of or do I need to do something on my end? Thank you

## 2021-12-16 ENCOUNTER — Ambulatory Visit
Admit: 2021-12-16 | Discharge: 2021-12-16 | Payer: BLUE CROSS/BLUE SHIELD | Attending: Family Medicine | Primary: Family Medicine

## 2021-12-16 DIAGNOSIS — F909 Attention-deficit hyperactivity disorder, unspecified type: Secondary | ICD-10-CM

## 2021-12-16 MED ORDER — AMPHETAMINE-DEXTROAMPHET ER 15 MG PO CP24
15 MG | ORAL_CAPSULE | Freq: Every day | ORAL | 0 refills | Status: DC
Start: 2021-12-16 — End: 2021-12-17

## 2021-12-16 MED ORDER — AMPHETAMINE-DEXTROAMPHET ER 15 MG PO CP24
15 MG | ORAL_CAPSULE | Freq: Every day | ORAL | 0 refills | Status: AC
Start: 2021-12-16 — End: 2022-03-16

## 2021-12-16 MED ORDER — AMPHETAMINE-DEXTROAMPHETAMINE 5 MG PO TABS
5 MG | ORAL_TABLET | Freq: Every day | ORAL | 0 refills | Status: AC
Start: 2021-12-16 — End: 2022-03-16

## 2021-12-16 MED ORDER — AMPHETAMINE-DEXTROAMPHETAMINE 5 MG PO TABS
5 MG | ORAL_TABLET | Freq: Every day | ORAL | 0 refills | Status: DC
Start: 2021-12-16 — End: 2021-12-17

## 2021-12-16 NOTE — Assessment & Plan Note (Signed)
Diagnosed in early childhood.  Has been on and off stimulant for years. Discussed risks and benefits of stimulant use. Patient wishes to continue with Adderall XR.  Has also taken vyvanse and concerta in the past without side effects. Currently taking Adderall XR 15 mg along with 5 mg short acting after lunch.  Signed controlled substance agreement on 06/2021.  Reviewed scripts database.

## 2021-12-16 NOTE — Progress Notes (Signed)
Jenna Sullivan is a 31 y.o. female who presents today for the following:  Chief Complaint   Patient presents with    Follow-up     3 month follow up        No Known Allergies    Current Outpatient Medications   Medication Sig Dispense Refill    amphetamine-dextroamphetamine (ADDERALL XR) 15 MG extended release capsule Take 1 capsule by mouth daily for 30 days. Max Daily Amount: 15 mg 30 capsule 0    amphetamine-dextroamphetamine (ADDERALL XR) 15 MG extended release capsule Take 1 capsule by mouth daily for 30 days. Max Daily Amount: 15 mg 30 capsule 0    amphetamine-dextroamphetamine (ADDERALL XR) 15 MG extended release capsule Take 1 capsule by mouth daily for 30 days. Max Daily Amount: 15 mg 30 capsule 0    amphetamine-dextroamphetamine (ADDERALL) 5 MG tablet Take 1 tablet by mouth daily for 30 days. In the afternoon Max Daily Amount: 5 mg 30 tablet 0    amphetamine-dextroamphetamine (ADDERALL) 5 MG tablet Take 1 tablet by mouth daily for 30 days. In the afternoon Max Daily Amount: 5 mg 30 tablet 0    amphetamine-dextroamphetamine (ADDERALL) 5 MG tablet Take 1 tablet by mouth daily for 30 days. Daily in the afternoon Max Daily Amount: 5 mg 30 tablet 0     No current facility-administered medications for this visit.       Past Medical History:   Diagnosis Date    ADHD (attention deficit hyperactivity disorder)        Past Surgical History:   Procedure Laterality Date    BREAST LUMPECTOMY Right     WISDOM TOOTH EXTRACTION         Social History     Tobacco Use    Smoking status: Never    Smokeless tobacco: Never   Substance Use Topics    Alcohol use: Yes     Comment: social        Family History   Problem Relation Age of Onset    Heart Attack Father     Esophageal Cancer Maternal Grandfather        Patient is here today for follow up regarding ADHD.  She has long standing history of ADHD diagnosed around age 61. Patient has been on adderral since 31 years old.  Used medication through college.  Has also tried vyvanse  and concerta in the past.  No significant side effect. Currently taking 15 ER in AM and 5 mg short acting in afternoon.  She is doing well with no concerns. She also has a history of migraines but is no longer taking medication.     Contraception: mirena IUD placed around 5 years ago.   Pap: around 6 years ago  Social: engaged to get married this weekend.    Immunizations  Flu: declines  COVID: received 3 doses    Exercise: running regularly  Diet: regular         Review of Systems   Constitutional:  Negative for fatigue and unexpected weight change.   Respiratory:  Negative for chest tightness, shortness of breath and wheezing.    Cardiovascular:  Negative for chest pain.   Neurological:  Negative for dizziness and light-headedness.      BP 132/80   Pulse 72   Ht 5\' 7"  (1.702 m)   Wt 152 lb (68.9 kg)   SpO2 97%   BMI 23.81 kg/m     Physical Exam  Constitutional:  Appearance: Normal appearance.   Cardiovascular:      Rate and Rhythm: Regular rhythm.      Heart sounds: Normal heart sounds.   Pulmonary:      Effort: Pulmonary effort is normal. No respiratory distress.      Breath sounds: Normal breath sounds.   Musculoskeletal:      Cervical back: Neck supple.   Neurological:      Mental Status: She is alert.        1. Attention deficit hyperactivity disorder (ADHD), unspecified ADHD type  Assessment & Plan:  Diagnosed in early childhood.  Has been on and off stimulant for years. Discussed risks and benefits of stimulant use. Patient wishes to continue with Adderall XR.  Has also taken vyvanse and concerta in the past without side effects. Currently taking Adderall XR 15 mg along with 5 mg short acting after lunch.  Signed controlled substance agreement on 06/2021.  Reviewed scripts database.  Orders:  -     amphetamine-dextroamphetamine (ADDERALL XR) 15 MG extended release capsule; Take 1 capsule by mouth daily for 30 days. Max Daily Amount: 15 mg, Disp-30 capsule, R-0Normal  -      amphetamine-dextroamphetamine (ADDERALL XR) 15 MG extended release capsule; Take 1 capsule by mouth daily for 30 days. Max Daily Amount: 15 mg, Disp-30 capsule, R-0Normal  -     amphetamine-dextroamphetamine (ADDERALL XR) 15 MG extended release capsule; Take 1 capsule by mouth daily for 30 days. Max Daily Amount: 15 mg, Disp-30 capsule, R-0Normal  -     amphetamine-dextroamphetamine (ADDERALL) 5 MG tablet; Take 1 tablet by mouth daily for 30 days. Max Daily Amount: 5 mg, Disp-30 tablet, R-0Normal  -     amphetamine-dextroamphetamine (ADDERALL) 5 MG tablet; Take 1 tablet by mouth daily for 30 days. Max Daily Amount: 5 mg, Disp-30 tablet, R-0Normal  -     amphetamine-dextroamphetamine (ADDERALL) 5 MG tablet; Take 1 tablet by mouth daily for 30 days. Max Daily Amount: 5 mg, Disp-30 tablet, R-0Normal       Patient informed, we will call with blood work results within one week.  If you have not heard regarding results in over a week, please contact office.  You can also review results on mychart.           Norva Pavlov, MD

## 2021-12-17 ENCOUNTER — Encounter

## 2021-12-17 MED ORDER — AMPHETAMINE-DEXTROAMPHETAMINE 5 MG PO TABS
5 MG | ORAL_TABLET | Freq: Every day | ORAL | 0 refills | Status: DC
Start: 2021-12-17 — End: 2022-03-18

## 2021-12-17 MED ORDER — AMPHETAMINE-DEXTROAMPHET ER 15 MG PO CP24
15 MG | ORAL_CAPSULE | Freq: Every day | ORAL | 0 refills | Status: DC
Start: 2021-12-17 — End: 2022-03-18

## 2021-12-17 NOTE — Telephone Encounter (Signed)
From: Phillips Grout  To: Dr. Nicolette Bang  Sent: 12/17/2021 4:30 PM EDT  Subject: Adderall outages     Good afternoon, can you send my prescription to [place] please, [facility] is out out adderall and [facility] has just enough!       [id number]  [address], [place] [address]

## 2021-12-17 NOTE — Telephone Encounter (Signed)
Diagnoses and all orders for this visit:    Attention deficit hyperactivity disorder (ADHD), unspecified ADHD type  -     amphetamine-dextroamphetamine (ADDERALL XR) 15 MG extended release capsule; Take 1 capsule by mouth daily for 30 days. Max Daily Amount: 15 mg  -     amphetamine-dextroamphetamine (ADDERALL XR) 15 MG extended release capsule; Take 1 capsule by mouth daily for 30 days. Max Daily Amount: 15 mg  -     amphetamine-dextroamphetamine (ADDERALL XR) 15 MG extended release capsule; Take 1 capsule by mouth daily for 30 days. Max Daily Amount: 15 mg  -     amphetamine-dextroamphetamine (ADDERALL) 5 MG tablet; Take 1 tablet by mouth daily for 30 days. Max Daily Amount: 5 mg  -     amphetamine-dextroamphetamine (ADDERALL) 5 MG tablet; Take 1 tablet by mouth daily for 30 days. Max Daily Amount: 5 mg  -     amphetamine-dextroamphetamine (ADDERALL) 5 MG tablet; Take 1 tablet by mouth daily for 30 days. Max Daily Amount: 5 mg

## 2022-03-18 ENCOUNTER — Ambulatory Visit
Admit: 2022-03-18 | Discharge: 2022-03-18 | Payer: BLUE CROSS/BLUE SHIELD | Attending: Family Medicine | Primary: Family Medicine

## 2022-03-18 DIAGNOSIS — F909 Attention-deficit hyperactivity disorder, unspecified type: Secondary | ICD-10-CM

## 2022-03-18 MED ORDER — AMPHETAMINE-DEXTROAMPHET ER 15 MG PO CP24
15 MG | ORAL_CAPSULE | Freq: Every day | ORAL | 0 refills | Status: AC
Start: 2022-03-18 — End: 2022-06-16

## 2022-03-18 MED ORDER — AMPHETAMINE-DEXTROAMPHET ER 15 MG PO CP24
15 MG | ORAL_CAPSULE | Freq: Every day | ORAL | 0 refills | Status: DC
Start: 2022-03-18 — End: 2022-06-16

## 2022-03-18 MED ORDER — AMPHETAMINE-DEXTROAMPHETAMINE 5 MG PO TABS
5 MG | ORAL_TABLET | Freq: Every day | ORAL | 0 refills | Status: DC
Start: 2022-03-18 — End: 2022-06-16

## 2022-03-18 MED ORDER — AMPHETAMINE-DEXTROAMPHETAMINE 5 MG PO TABS
5 MG | ORAL_TABLET | Freq: Every day | ORAL | 0 refills | Status: AC
Start: 2022-03-18 — End: 2022-06-16

## 2022-03-18 MED ORDER — AMPHETAMINE-DEXTROAMPHET ER 15 MG PO CP24
15 MG | ORAL_CAPSULE | Freq: Every day | ORAL | 0 refills | Status: AC
Start: 2022-03-18 — End: 2022-04-17

## 2022-03-18 NOTE — Assessment & Plan Note (Signed)
Diagnosed in early childhood.  Has been on and off stimulant for years. Discussed risks and benefits of stimulant use. Patient wishes to continue with Adderall XR.  Has also taken vyvanse and concerta in the past without side effects. Currently taking Adderall XR 15 mg along with 5 mg short acting after lunch.  Signed controlled substance agreement on 06/2021.  Reviewed scripts database.  Will be due for repeat UDS 06/2022

## 2022-03-18 NOTE — Progress Notes (Signed)
Jenna Sullivan is a 31 y.o. female who presents today for the following:  Chief Complaint   Patient presents with    Follow-up    ADHD       No Known Allergies    Current Outpatient Medications   Medication Sig Dispense Refill    amphetamine-dextroamphetamine (ADDERALL XR) 15 MG extended release capsule Take 1 capsule by mouth daily for 30 days. Max Daily Amount: 15 mg 30 capsule 0    amphetamine-dextroamphetamine (ADDERALL XR) 15 MG extended release capsule Take 1 capsule by mouth daily for 30 days. Max Daily Amount: 15 mg 30 capsule 0    amphetamine-dextroamphetamine (ADDERALL XR) 15 MG extended release capsule Take 1 capsule by mouth daily for 30 days. Max Daily Amount: 15 mg 30 capsule 0    amphetamine-dextroamphetamine (ADDERALL) 5 MG tablet Take 1 tablet by mouth daily for 30 days. Max Daily Amount: 5 mg 30 tablet 0    amphetamine-dextroamphetamine (ADDERALL) 5 MG tablet Take 1 tablet by mouth daily for 30 days. Max Daily Amount: 5 mg 30 tablet 0    amphetamine-dextroamphetamine (ADDERALL) 5 MG tablet Take 1 tablet by mouth daily for 30 days. Max Daily Amount: 5 mg 30 tablet 0     No current facility-administered medications for this visit.       Past Medical History:   Diagnosis Date    ADHD (attention deficit hyperactivity disorder)        Past Surgical History:   Procedure Laterality Date    BREAST LUMPECTOMY Right     WISDOM TOOTH EXTRACTION         Social History     Tobacco Use    Smoking status: Never    Smokeless tobacco: Never   Substance Use Topics    Alcohol use: Yes     Comment: social        Family History   Problem Relation Age of Onset    Heart Attack Father     Esophageal Cancer Maternal Grandfather        Patient is here today for follow up regarding ADHD.  She has long standing history of ADHD diagnosed around age 70. Patient has been on adderral since 31 years old.  Used medication through college.  Has also tried vyvanse and concerta in the past.  No significant side effect. Currently taking  15 ER in AM and 5 mg short acting in afternoon.  She is doing well with no concerns. She also has a history of migraines but is no longer taking medication.     Contraception: mirena IUD placed around 5 years ago.   Pap: 09/2021  Social: engaged to get married this weekend.    Immunizations  Flu: declines  COVID: received 3 doses    Exercise: running regularly  Diet: regular         Review of Systems   Constitutional:  Negative for activity change, appetite change, fatigue and unexpected weight change.   Respiratory:  Negative for chest tightness, shortness of breath and wheezing.    Neurological:  Negative for dizziness and light-headedness.      BP 128/64   Pulse 82   Temp 98.2 F (36.8 C) (Oral)   Ht 5\' 7"  (1.702 m)   Wt 154 lb 3.2 oz (69.9 kg)   SpO2 100%   BMI 24.15 kg/m     Physical Exam  Vitals reviewed.   Constitutional:       Appearance: Normal appearance.   Cardiovascular:  Rate and Rhythm: Normal rate and regular rhythm.      Heart sounds: Normal heart sounds. No murmur heard.  Pulmonary:      Effort: Pulmonary effort is normal. No respiratory distress.      Breath sounds: Normal breath sounds.   Musculoskeletal:      Cervical back: Neck supple.   Neurological:      Mental Status: She is alert.        1. Attention deficit hyperactivity disorder (ADHD), unspecified ADHD type  Assessment & Plan:  Diagnosed in early childhood.  Has been on and off stimulant for years. Discussed risks and benefits of stimulant use. Patient wishes to continue with Adderall XR.  Has also taken vyvanse and concerta in the past without side effects. Currently taking Adderall XR 15 mg along with 5 mg short acting after lunch.  Signed controlled substance agreement on 06/2021.  Reviewed scripts database.  Will be due for repeat UDS 06/2022  Orders:  -     amphetamine-dextroamphetamine (ADDERALL XR) 15 MG extended release capsule; Take 1 capsule by mouth daily for 30 days. Max Daily Amount: 15 mg, Disp-30 capsule,  R-0Normal  -     amphetamine-dextroamphetamine (ADDERALL XR) 15 MG extended release capsule; Take 1 capsule by mouth daily for 30 days. Max Daily Amount: 15 mg, Disp-30 capsule, R-0Normal  -     amphetamine-dextroamphetamine (ADDERALL XR) 15 MG extended release capsule; Take 1 capsule by mouth daily for 30 days. Max Daily Amount: 15 mg, Disp-30 capsule, R-0Normal  -     amphetamine-dextroamphetamine (ADDERALL) 5 MG tablet; Take 1 tablet by mouth daily for 30 days. Max Daily Amount: 5 mg, Disp-30 tablet, R-0Normal  -     amphetamine-dextroamphetamine (ADDERALL) 5 MG tablet; Take 1 tablet by mouth daily for 30 days. Max Daily Amount: 5 mg, Disp-30 tablet, R-0Normal  -     amphetamine-dextroamphetamine (ADDERALL) 5 MG tablet; Take 1 tablet by mouth daily for 30 days. Max Daily Amount: 5 mg, Disp-30 tablet, R-0Normal       Patient informed, we will call with blood work results within one week.  If you have not heard regarding results in over a week, please contact office.  You can also review results on mychart.           Norva Pavlov, MD

## 2022-06-16 ENCOUNTER — Encounter

## 2022-06-16 ENCOUNTER — Encounter
Admit: 2022-06-16 | Discharge: 2022-06-16 | Payer: BLUE CROSS/BLUE SHIELD | Attending: Family Medicine | Primary: Family Medicine

## 2022-06-16 DIAGNOSIS — F909 Attention-deficit hyperactivity disorder, unspecified type: Secondary | ICD-10-CM

## 2022-06-16 MED ORDER — AMPHETAMINE-DEXTROAMPHET ER 15 MG PO CP24
15 MG | ORAL_CAPSULE | Freq: Every day | ORAL | 0 refills | Status: AC
Start: 2022-06-16 — End: 2022-09-14

## 2022-06-16 MED ORDER — AMPHETAMINE-DEXTROAMPHET ER 15 MG PO CP24
15 MG | ORAL_CAPSULE | Freq: Every day | ORAL | 0 refills | Status: AC
Start: 2022-06-16 — End: 2022-07-16

## 2022-06-16 MED ORDER — AMPHETAMINE-DEXTROAMPHETAMINE 5 MG PO TABS
5 MG | ORAL_TABLET | Freq: Every day | ORAL | 0 refills | Status: AC
Start: 2022-06-16 — End: 2022-09-14

## 2022-06-16 MED ORDER — AMPHETAMINE-DEXTROAMPHETAMINE 5 MG PO TABS
5 MG | ORAL_TABLET | Freq: Every day | ORAL | 0 refills | Status: AC
Start: 2022-06-16 — End: 2022-07-16

## 2022-06-16 MED ORDER — AMPHETAMINE-DEXTROAMPHET ER 15 MG PO CP24
15 MG | ORAL_CAPSULE | Freq: Every day | ORAL | 0 refills | Status: AC
Start: 2022-06-16 — End: 2022-08-15

## 2022-06-16 MED ORDER — AMPHETAMINE-DEXTROAMPHETAMINE 5 MG PO TABS
5 MG | ORAL_TABLET | Freq: Every day | ORAL | 0 refills | Status: AC
Start: 2022-06-16 — End: 2022-08-15

## 2022-06-16 NOTE — Progress Notes (Unsigned)
Jenna Sullivan is a 31 y.o. female who presents today for the following:  Chief Complaint   Patient presents with    ADHD     3 month follow up        No Known Allergies    Current Outpatient Medications   Medication Sig Dispense Refill    amphetamine-dextroamphetamine (ADDERALL XR) 15 MG extended release capsule Take 1 capsule by mouth daily for 30 days. Max Daily Amount: 15 mg 30 capsule 0    amphetamine-dextroamphetamine (ADDERALL XR) 15 MG extended release capsule Take 1 capsule by mouth daily for 30 days. Max Daily Amount: 15 mg 30 capsule 0    amphetamine-dextroamphetamine (ADDERALL XR) 15 MG extended release capsule Take 1 capsule by mouth daily for 30 days. Max Daily Amount: 15 mg 30 capsule 0    amphetamine-dextroamphetamine (ADDERALL) 5 MG tablet Take 1 tablet by mouth daily for 30 days. Max Daily Amount: 5 mg 30 tablet 0    amphetamine-dextroamphetamine (ADDERALL) 5 MG tablet Take 1 tablet by mouth daily for 30 days. Max Daily Amount: 5 mg 30 tablet 0    amphetamine-dextroamphetamine (ADDERALL) 5 MG tablet Take 1 tablet by mouth daily for 30 days. Max Daily Amount: 5 mg 30 tablet 0     No current facility-administered medications for this visit.       Past Medical History:   Diagnosis Date    ADHD (attention deficit hyperactivity disorder)        Past Surgical History:   Procedure Laterality Date    BREAST LUMPECTOMY Right     WISDOM TOOTH EXTRACTION         Social History     Tobacco Use    Smoking status: Never    Smokeless tobacco: Never   Substance Use Topics    Alcohol use: Yes     Comment: social        Family History   Problem Relation Age of Onset    Heart Attack Father     Esophageal Cancer Maternal Grandfather        Patient is here today for follow up regarding ADHD.  She has long standing history of ADHD diagnosed around age 82. Patient has been on adderral since 31 years old.  Used medication through college.  Has also tried vyvanse and concerta in the past.  No significant side effect.  Currently taking 15 ER in AM and 5 mg short acting in afternoon.  She is doing well with no concerns. She also has a history of migraines but is no longer taking medication.     Contraception: mirena IUD placed around 5 years ago.   Pap: 09/2021  Social: engaged to get married this weekend.    Immunizations  Flu: declines  COVID: received 3 doses    Exercise: running regularly  Diet: regular           Review of Systems     BP 122/78   Pulse 72   Temp 97.9 F (36.6 C) (Oral)   Ht 1.702 m (5\' 7" )   Wt 72.1 kg (159 lb)   SpO2 100%   BMI 24.90 kg/m     Physical Exam     1. Attention deficit hyperactivity disorder (ADHD), unspecified ADHD type  2. Long term current use of therapeutic drug  -     Urine Drug Screen; Future       Patient informed, we will call with blood work results within one week.  If you have  not heard regarding results in over a week, please contact office.  You can also review results on mychart.           Norva Pavlov, MD

## 2022-06-17 LAB — URINE DRUG SCREEN
Amphetamine, Urine: POSITIVE — AB
Barbiturates, Urine: NEGATIVE
Benzodiazepines, Urine: NEGATIVE
Cocaine, Urine: NEGATIVE
Methadone, Urine: NEGATIVE
Opiates, Urine: NEGATIVE
PCP, Urine: NEGATIVE
THC, TH-Cannabinol, Urine: NEGATIVE

## 2022-06-17 NOTE — Assessment & Plan Note (Signed)
Diagnosed in early childhood.  Has been on and off stimulant for years. Discussed risks and benefits of stimulant use. Patient wishes to continue with Adderall XR.  Has also taken vyvanse and concerta in the past without side effects. Currently taking Adderall XR 15 mg along with 5 mg short acting after lunch.  Signed controlled substance agreement on 06/2021.  Reviewed scripts database. Repeated UDS today.  Briefly discussed stimulant use in pregnancy.  There is very limited data regarding safety.  Would recommend trial of stopping medication if desires pregnancy.  Recommended discussing further with her ob/gyn.

## 2022-09-15 ENCOUNTER — Encounter: Payer: BLUE CROSS/BLUE SHIELD | Attending: Family Medicine | Primary: Family Medicine

## 2022-09-15 ENCOUNTER — Encounter
Admit: 2022-09-15 | Discharge: 2022-09-15 | Payer: BLUE CROSS/BLUE SHIELD | Attending: Family Medicine | Primary: Family Medicine

## 2022-09-15 DIAGNOSIS — F909 Attention-deficit hyperactivity disorder, unspecified type: Secondary | ICD-10-CM

## 2022-09-15 MED ORDER — AMPHETAMINE-DEXTROAMPHET ER 5 MG PO CP24
5 | ORAL_CAPSULE | Freq: Every day | ORAL | 0 refills | Status: DC
Start: 2022-09-15 — End: 2022-09-16

## 2022-09-15 MED ORDER — AMPHETAMINE-DEXTROAMPHET ER 15 MG PO CP24
15 | ORAL_CAPSULE | Freq: Every day | ORAL | 0 refills | Status: AC
Start: 2022-09-15 — End: 2022-11-14

## 2022-09-15 MED ORDER — AMPHETAMINE-DEXTROAMPHET ER 15 MG PO CP24
15 | ORAL_CAPSULE | Freq: Every day | ORAL | 0 refills | Status: AC
Start: 2022-09-15 — End: 2022-10-15

## 2022-09-15 MED ORDER — AMPHETAMINE-DEXTROAMPHET ER 15 MG PO CP24
15 | ORAL_CAPSULE | Freq: Every day | ORAL | 0 refills | Status: AC
Start: 2022-09-15 — End: 2022-12-14

## 2022-09-15 NOTE — Assessment & Plan Note (Signed)
Diagnosed in early childhood.  Has been on and off stimulant for years. Discussed risks and benefits of stimulant use. Patient wishes to continue with Adderall XR.  Has also taken vyvanse and concerta in the past without side effects. Currently taking Adderall XR 15 mg along with 5 mg short acting after lunch.  Signed controlled substance agreement on 06/2021.  Reviewed scripts database. UDS done 06/2022. Patient plans to stop medication if becomes pregnant.

## 2022-09-15 NOTE — Progress Notes (Unsigned)
Jenna Sullivan is a 32 y.o. female who presents today for the following:  No chief complaint on file.      No Known Allergies    Current Outpatient Medications   Medication Sig Dispense Refill    amphetamine-dextroamphetamine (ADDERALL) 5 MG tablet Take 1 tablet by mouth daily for 30 days. Max Daily Amount: 5 mg 30 tablet 0    amphetamine-dextroamphetamine (ADDERALL) 5 MG tablet Take 1 tablet by mouth daily for 30 days. Max Daily Amount: 5 mg 30 tablet 0    amphetamine-dextroamphetamine (ADDERALL) 5 MG tablet Take 1 tablet by mouth daily for 30 days. Max Daily Amount: 5 mg 30 tablet 0    amphetamine-dextroamphetamine (ADDERALL XR) 15 MG extended release capsule Take 1 capsule by mouth daily for 30 days. Max Daily Amount: 15 mg 30 capsule 0    amphetamine-dextroamphetamine (ADDERALL XR) 15 MG extended release capsule Take 1 capsule by mouth daily for 30 days. Max Daily Amount: 15 mg 30 capsule 0    amphetamine-dextroamphetamine (ADDERALL XR) 15 MG extended release capsule Take 1 capsule by mouth daily for 30 days. Max Daily Amount: 15 mg 30 capsule 0     No current facility-administered medications for this visit.       Past Medical History:   Diagnosis Date    ADHD (attention deficit hyperactivity disorder)        Past Surgical History:   Procedure Laterality Date    BREAST LUMPECTOMY Right     WISDOM TOOTH EXTRACTION         Social History     Tobacco Use    Smoking status: Never    Smokeless tobacco: Never   Substance Use Topics    Alcohol use: Yes     Comment: social        Family History   Problem Relation Age of Onset    Heart Attack Father     Esophageal Cancer Maternal Grandfather        Patient is here today for follow up regarding ADHD.  She has long standing history of ADHD diagnosed around age 25. Patient has been on adderral since 32 years old.  Used medication through college.  Has also tried vyvanse and concerta in the past.  No significant side effect. Currently taking 15 ER in AM and 5 mg short acting  in afternoon.  She is doing well with no concerns. She also has a history of migraines but is no longer taking medication.     Contraception: mirena IUD placed around 5 years ago. Contemplating pregnancy in next 1-2 years.   Pap: 09/2021  Social: married.    Immunizations  Flu: declines  COVID: received 3 doses    Exercise: running regularly  Diet: regular           Review of Systems     There were no vitals taken for this visit.    Physical Exam     1. Attention deficit hyperactivity disorder (ADHD), unspecified ADHD type       Patient informed, we will call with blood work results within one week.  If you have not heard regarding results in over a week, please contact office.  You can also review results on mychart.           Norva Pavlov, MD

## 2022-09-16 ENCOUNTER — Telehealth

## 2022-09-16 MED ORDER — AMPHETAMINE-DEXTROAMPHETAMINE 5 MG PO TABS
5 | ORAL_TABLET | Freq: Every day | ORAL | 0 refills | Status: AC
Start: 2022-09-16 — End: 2022-12-15

## 2022-09-16 MED ORDER — AMPHETAMINE-DEXTROAMPHETAMINE 5 MG PO TABS
5 | ORAL_TABLET | Freq: Every day | ORAL | 0 refills | Status: AC
Start: 2022-09-16 — End: 2022-11-15

## 2022-09-16 MED ORDER — AMPHETAMINE-DEXTROAMPHETAMINE 5 MG PO TABS
5 | ORAL_TABLET | Freq: Every day | ORAL | 0 refills | Status: AC
Start: 2022-09-16 — End: 2022-10-16

## 2022-09-16 NOTE — Telephone Encounter (Signed)
RX was resent to pharmacy.   Called pharmacy and left a message on provider line, notifying them that the correct prescription was sent in. [facility] office number left on voicemail in case there are any further questions.

## 2022-09-16 NOTE — Telephone Encounter (Signed)
Received call from Gering at [facility], needing to confirm Adderall prescription. She states that patient normally get the Adderall '5mg'$  immediate release but the prescription that was sent yesterday was for extended release.     Informed that I would confirm w/ Dr. Tamala Julian & call back. Joellen Jersey provided me w/ a return number of [phone].
